# Patient Record
Sex: Female | Born: 1970 | Race: White | Hispanic: No | Marital: Married | Smoking: Never smoker
Health system: Southern US, Community
[De-identification: ages and names within clinical notes are randomized; demographics above are authoritative.]

## PROBLEM LIST (undated history)

## (undated) DIAGNOSIS — N920 Excessive and frequent menstruation with regular cycle: Secondary | ICD-10-CM

## (undated) DIAGNOSIS — M5126 Other intervertebral disc displacement, lumbar region: Secondary | ICD-10-CM

## (undated) DIAGNOSIS — E282 Polycystic ovarian syndrome: Secondary | ICD-10-CM

## (undated) DIAGNOSIS — F32A Depression, unspecified: Secondary | ICD-10-CM

## (undated) DIAGNOSIS — F329 Major depressive disorder, single episode, unspecified: Secondary | ICD-10-CM

## (undated) DIAGNOSIS — D6851 Activated protein C resistance: Principal | ICD-10-CM

## (undated) DIAGNOSIS — F429 Obsessive-compulsive disorder, unspecified: Secondary | ICD-10-CM

## (undated) DIAGNOSIS — E785 Hyperlipidemia, unspecified: Secondary | ICD-10-CM

## (undated) HISTORY — DX: Activated protein C resistance: D68.51

## (undated) HISTORY — PX: OTHER SURGICAL HISTORY: SHX169

## (undated) HISTORY — DX: Excessive and frequent menstruation with regular cycle: N92.0

## (undated) HISTORY — DX: Major depressive disorder, single episode, unspecified: F32.9

## (undated) HISTORY — PX: CHOLECYSTECTOMY: SHX55

## (undated) HISTORY — DX: Depression, unspecified: F32.A

## (undated) HISTORY — DX: Polycystic ovarian syndrome: E28.2

## (undated) HISTORY — DX: Obsessive-compulsive disorder, unspecified: F42.9

## (undated) HISTORY — DX: Other intervertebral disc displacement, lumbar region: M51.26

## (undated) HISTORY — DX: Hyperlipidemia, unspecified: E78.5

## (undated) HISTORY — PX: WISDOM TOOTH EXTRACTION: SHX21

---

## 2002-07-25 ENCOUNTER — Encounter
Admission: RE | Admit: 2002-07-25 | Discharge: 2002-07-25 | Payer: Self-pay | Admitting: Physical Medicine & Rehabilitation

## 2002-07-25 ENCOUNTER — Encounter: Payer: Self-pay | Admitting: Physical Medicine & Rehabilitation

## 2002-07-30 ENCOUNTER — Encounter
Admission: RE | Admit: 2002-07-30 | Discharge: 2002-09-05 | Payer: Self-pay | Admitting: Physical Medicine & Rehabilitation

## 2003-03-13 ENCOUNTER — Encounter
Admission: RE | Admit: 2003-03-13 | Discharge: 2003-06-11 | Payer: Self-pay | Admitting: Physical Medicine & Rehabilitation

## 2003-08-03 ENCOUNTER — Emergency Department (HOSPITAL_COMMUNITY): Admission: EM | Admit: 2003-08-03 | Discharge: 2003-08-03 | Payer: Self-pay | Admitting: Emergency Medicine

## 2003-10-08 ENCOUNTER — Ambulatory Visit (HOSPITAL_COMMUNITY): Admission: RE | Admit: 2003-10-08 | Discharge: 2003-10-09 | Payer: Self-pay | Admitting: Neurosurgery

## 2004-07-07 ENCOUNTER — Other Ambulatory Visit: Admission: RE | Admit: 2004-07-07 | Discharge: 2004-07-07 | Payer: Self-pay | Admitting: Obstetrics and Gynecology

## 2005-06-21 ENCOUNTER — Encounter: Admission: RE | Admit: 2005-06-21 | Discharge: 2005-06-21 | Payer: Self-pay | Admitting: Internal Medicine

## 2006-11-30 ENCOUNTER — Encounter
Admission: RE | Admit: 2006-11-30 | Discharge: 2006-11-30 | Payer: Self-pay | Admitting: Physical Medicine and Rehabilitation

## 2006-12-11 ENCOUNTER — Encounter
Admission: RE | Admit: 2006-12-11 | Discharge: 2006-12-11 | Payer: Self-pay | Admitting: Physical Medicine and Rehabilitation

## 2008-05-06 ENCOUNTER — Inpatient Hospital Stay (HOSPITAL_COMMUNITY): Admission: AD | Admit: 2008-05-06 | Discharge: 2008-05-06 | Payer: Self-pay | Admitting: Obstetrics and Gynecology

## 2008-08-04 ENCOUNTER — Encounter: Admission: RE | Admit: 2008-08-04 | Discharge: 2008-08-28 | Payer: Self-pay | Admitting: Obstetrics and Gynecology

## 2008-08-28 ENCOUNTER — Encounter: Admission: RE | Admit: 2008-08-28 | Discharge: 2008-08-28 | Payer: Self-pay | Admitting: Obstetrics and Gynecology

## 2008-09-23 ENCOUNTER — Inpatient Hospital Stay (HOSPITAL_COMMUNITY): Admission: AD | Admit: 2008-09-23 | Discharge: 2008-09-23 | Payer: Self-pay | Admitting: Obstetrics and Gynecology

## 2008-12-14 ENCOUNTER — Inpatient Hospital Stay (HOSPITAL_COMMUNITY): Admission: AD | Admit: 2008-12-14 | Discharge: 2008-12-18 | Payer: Self-pay | Admitting: Obstetrics and Gynecology

## 2008-12-15 ENCOUNTER — Encounter (INDEPENDENT_AMBULATORY_CARE_PROVIDER_SITE_OTHER): Payer: Self-pay | Admitting: Obstetrics and Gynecology

## 2008-12-22 ENCOUNTER — Ambulatory Visit: Admission: RE | Admit: 2008-12-22 | Discharge: 2008-12-22 | Payer: Self-pay | Admitting: Obstetrics and Gynecology

## 2010-02-07 ENCOUNTER — Inpatient Hospital Stay (HOSPITAL_COMMUNITY): Admission: AD | Admit: 2010-02-07 | Discharge: 2010-02-07 | Payer: Self-pay | Admitting: Obstetrics and Gynecology

## 2010-02-08 ENCOUNTER — Inpatient Hospital Stay (HOSPITAL_COMMUNITY): Admission: AD | Admit: 2010-02-08 | Discharge: 2010-02-08 | Payer: Self-pay | Admitting: Obstetrics and Gynecology

## 2010-07-12 ENCOUNTER — Ambulatory Visit (HOSPITAL_COMMUNITY): Admission: RE | Admit: 2010-07-12 | Discharge: 2010-07-12 | Payer: Self-pay | Admitting: Obstetrics and Gynecology

## 2010-09-27 ENCOUNTER — Inpatient Hospital Stay (HOSPITAL_COMMUNITY)
Admission: RE | Admit: 2010-09-27 | Discharge: 2010-09-30 | Payer: Self-pay | Source: Home / Self Care | Attending: Obstetrics and Gynecology | Admitting: Obstetrics and Gynecology

## 2010-10-04 LAB — CBC
HCT: 28.5 % — ABNORMAL LOW (ref 36.0–46.0)
Hemoglobin: 9.4 g/dL — ABNORMAL LOW (ref 12.0–15.0)
MCH: 30.1 pg (ref 26.0–34.0)
MCHC: 33 g/dL (ref 30.0–36.0)
MCV: 91.3 fL (ref 78.0–100.0)
Platelets: 187 10*3/uL (ref 150–400)
RBC: 3.12 MIL/uL — ABNORMAL LOW (ref 3.87–5.11)
RDW: 13.5 % (ref 11.5–15.5)
WBC: 9 10*3/uL (ref 4.0–10.5)

## 2010-10-04 LAB — GLUCOSE, CAPILLARY
Glucose-Capillary: 100 mg/dL — ABNORMAL HIGH (ref 70–99)
Glucose-Capillary: 87 mg/dL (ref 70–99)
Glucose-Capillary: 121 mg/dL — ABNORMAL HIGH (ref 70–99)
Glucose-Capillary: 106 mg/dL — ABNORMAL HIGH (ref 70–99)

## 2010-10-04 LAB — RPR: RPR Ser Ql: NONREACTIVE

## 2010-11-01 NOTE — Discharge Summary (Signed)
NAMEJAYLIANI, Rachel Ford NO.:  [id number]  MEDICAL RECORD NO.:  [id number]          PATIENT TYPE:  INP  LOCATION:  9145                          FACILITY:  [facility]  PHYSICIAN:  Osborn Coho, M.D.   DATE OF BIRTH:  1971-03-27  DATE OF ADMISSION:  09/27/2010 DATE OF DISCHARGE:  09/30/2010                              DISCHARGE SUMMARY   ADMITTING DIAGNOSES: 1. Intrauterine pregnancy at 40 and 4/7 weeks. 2. Previous cesarean section with desire for repeat. 3. Gestational diabetes. 4. Rh negative. 5. Advanced maternal age.  DISCHARGE DIAGNOSES: 1. Intrauterine pregnancy at 40 and 4/7 weeks. 2. Previous cesarean section with desire for repeat. 3. Gestational diabetes. 4. Rh negative. 5. Advanced maternal age. 6. Postpartum anemia.  PROCEDURES: 1. Repeat low transverse cesarean section. 2. Spinal anesthesia.  HOSPITAL COURSE:  Ms. Shackleton is a 40 year old gravida 3, para 1-0-1-1 at 40 and 4/7 weeks by first trimester ultrasound, 39 and 6/7 weeks by LMP, who presented on the day of admission for repeat low transverse cesarean section.  Her pregnancy had been remarkable for; 1. Advanced maternal age with normal screening. 2. Previous cesarean section with desire for repeat. 3. Rh negative. 4. Presumptive gestational diabetes. 5. PCOS. 6. Group B strep negative.  After arrival, the patient was taken to the operating room where Dr. Estanislado Pandy performed a repeat low transverse cesarean section under spinal anesthesia.  Findings were a viable female, named Jill Alexanders, weight 8 pounds 6 ounces, Apgars were 8 and 9.  Infant was taken to the Full-Term Nursery.  Mother was taken to recovery in good condition.  By postop day #1, the patient was doing well.  She was up ad lib, tolerating a regular diet.  Breastfeeding was initiated.  She was undecided about birth control.  She had a blood sugar at 5 a.m. of 121 after a midnight dinner. Hemoglobin was 9.4 down from 11.8, white  blood cell count was 9.0, and platelet count was 187.  She had a repeat blood sugar done on January 11, which was 106.  JP drain had between 15 and 17 mL of output per 24 hours.  She had no significant issues, had some nausea but was passing flatus and also had a bowel movement.  Infant was initiating breastfeeding fairly slowly but was doing well.  By postop day #3, the patient was doing well.  She still had some nausea, although she was tolerating regular diet.  She was up ad lib, had no significant abdominal pain other than incisional pain.  Infant did have some jaundice, but was going to be allowed to go home.  The patient's father was also sick with an upper respiratory infection.  The patient's JP drain was draining 15 mL overnight, but had minimal by the morning, this was removed without difficulty.  Her incision was clean, dry, and intact.  She did have some mild erythema above the incision site at the site of OR drape, but the incision itself was intact without any obvious difficulty.  Fundus was firm.  Lochia was scant.  The patient was deemed to receive full benefit of her hospital stay and  was discharged home in stable condition.  DISCHARGE INSTRUCTIONS:  Per [facility] handout.  DISCHARGE MEDICATIONS: 1. Motrin 600 mg p.o. q.6 h. p.r.n. pain. 2. Percocet 5/325 one to two p.o. q.3-4 h. p.r.n. pain.  DISCHARGE FOLLOWUP:  Discharge followup will occur in 6 weeks at [facility].  The patient was unsure about contraception.     Renaldo Reel Emilee Hero, C.N.M.   ______________________________ Osborn Coho, M.D.    VLL/MEDQ  D:  09/30/2010  T:  09/30/2010  Job:  [id number]  Electronically Signed by Nigel Bridgeman C.N.M. on 10/01/2010 05:13:35 PM Electronically Signed by Osborn Coho M.D. on 11/01/2010 10:58:59 AM

## 2010-11-29 LAB — BASIC METABOLIC PANEL
BUN: 9 mg/dL (ref 6–23)
CO2: 21 mEq/L (ref 19–32)
Calcium: 8.9 mg/dL (ref 8.4–10.5)
Chloride: 108 mEq/L (ref 96–112)
Creatinine, Ser: 0.61 mg/dL (ref 0.4–1.2)
GFR calc Af Amer: >60 mL/min (ref >60)
GFR calc non Af Amer: >60 mL/min (ref >60)
Glucose, Bld: 141 mg/dL — ABNORMAL HIGH (ref 70–99)
Potassium: 3.7 mEq/L (ref 3.5–5.1)
Sodium: 136 mEq/L (ref 135–145)

## 2010-11-29 LAB — CBC
HCT: 35.2 % — ABNORMAL LOW (ref 36.0–46.0)
Hemoglobin: 11.8 g/dL — ABNORMAL LOW (ref 12.0–15.0)
MCH: 30.1 pg (ref 26.0–34.0)
MCHC: 33.5 g/dL (ref 30.0–36.0)
MCV: 89.8 fL (ref 78.0–100.0)
Platelets: 226 10*3/uL (ref 150–400)
RBC: 3.92 MIL/uL (ref 3.87–5.11)
RDW: 13.5 % (ref 11.5–15.5)
WBC: 10 10*3/uL (ref 4.0–10.5)

## 2010-11-29 LAB — SURGICAL PCR SCREEN
MRSA, PCR: NEGATIVE
Staphylococcus aureus: NEGATIVE

## 2010-12-01 LAB — RH IMMUNE GLOBULIN WORKUP (NOT WOMEN'S HOSP)
ABO/RH(D): O NEG
Antibody Screen: NEGATIVE
Unit division: 0

## 2010-12-06 LAB — RH IMMUNE GLOBULIN WORKUP (NOT WOMEN'S HOSP)
ABO/RH(D): O NEG
Antibody Screen: NEGATIVE

## 2010-12-06 LAB — ANTIBODY SCREEN: Antibody Screen: NEGATIVE

## 2010-12-29 LAB — GLUCOSE, CAPILLARY: Glucose-Capillary: 74 mg/dL (ref 70–99)

## 2010-12-30 LAB — CBC
HCT: 35.5 % — ABNORMAL LOW (ref 36.0–46.0)
Hemoglobin: 10.3 g/dL — ABNORMAL LOW (ref 12.0–15.0)
Hemoglobin: 12 g/dL (ref 12.0–15.0)
MCHC: 33.8 g/dL (ref 30.0–36.0)
MCHC: 33.9 g/dL (ref 30.0–36.0)
MCV: 92.6 fL (ref 78.0–100.0)
Platelets: 216 10*3/uL (ref 150–400)
RBC: 3.27 MIL/uL — ABNORMAL LOW (ref 3.87–5.11)
RBC: 3.83 MIL/uL — ABNORMAL LOW (ref 3.87–5.11)
RDW: 13.7 % (ref 11.5–15.5)
WBC: 8.6 10*3/uL (ref 4.0–10.5)

## 2010-12-30 LAB — GLUCOSE, CAPILLARY
Glucose-Capillary: 82 mg/dL (ref 70–99)
Glucose-Capillary: 87 mg/dL (ref 70–99)
Glucose-Capillary: 99 mg/dL (ref 70–99)

## 2010-12-30 LAB — RH IMMUNE GLOB WKUP(>/=20WKS)(NOT WOMEN'S HOSP): Fetal Screen: NEGATIVE

## 2010-12-30 LAB — RPR: RPR Ser Ql: NONREACTIVE

## 2010-12-30 LAB — GLUCOSE, RANDOM: Glucose, Bld: 97 mg/dL (ref 70–99)

## 2011-01-03 LAB — RH IMMUNE GLOBULIN WORKUP (NOT WOMEN'S HOSP): Antibody Screen: NEGATIVE

## 2011-02-01 NOTE — Discharge Summary (Signed)
NAMEROJEAN, IGE NO.:  [id number]   MEDICAL RECORD NO.:  [id number]          PATIENT TYPE:  INP   LOCATION:  9125                          [facility]ILITY[facility]  [facility]   PHYSICIAN:  Janine Limbo, M.D.DATE OF BIRTH:  07/29/1971   DATE OF ADMISSION:  12/14/2008  DATE OF DISCHARGE:  12/18/2008                               DISCHARGE SUMMARY   ADMITTING DIAGNOSES:  1. Intrauterine pregnancy at 39-5/7 weeks.  2. Gestational diabetes.  3. Positive group B streptococcus.  4. Advanced maternal age.  5. Lower back pain and right lower extremity pain related to previous      back surgery and sciatica.   DISCHARGE DIAGNOSES:  1. Term pregnancy.  2. Gestational diabetes.  3. Failure to progress.  4. Obesity.  5. Persistent left leg pain, post delivery.   PROCEDURES:  1. Primary low transverse cesarean section.  2. Placement and then removal of JP drain.   HOSPITAL COURSE:  Ms. Martindelcampo is a 40 year old, gravida 1, para 0 at 62-  5/7 weeks, who was admitted for induction on the evening of December 14, 2008 due to gestational diabetes.  On admission, fetal heart rate was  reactive.  Physical exam was within normal limits.  Her pregnancy had  been remarkable for:  1. Advanced maternal age.  2. History of infertility.  3. First trimester spotting.  4. Rh negative.  5. Positive group B strep.  6. Increased BMI.  7. Gestational diabetes, diet-controlled.  8. Lower back pain and right lower extremity pain.  Left and right      lower extremity pain related to previous back injury or back      surgery and sciatica.   On admission, cervix was long and closed.  Glucose was 97.  The patient  was placed on Cytotec that night.  By the late morning, her cervix was 2-  3, 80% vertex, -1 and -2.  Artificial rupture of membranes was  accomplished and internal monitors were placed.  There was a 4-minute  deceleration.  This did respond to usual measures and amnioinfusion.  She did  have some variable decelerations subsequent to that.  Pitocin  was stopped.  Fetal heart rate began to recover and Pitocin was begun  again.  An epidural was placed, but the patient did not yet very  comfortable with this.  She continued to have some sporadic  decelerations, but she did have a stable baseline and accelerations  noted.  [place] has continued to be inadequate.  C-section was  offered at 630 when the cervix was still 5, 80, -1 to 0 station with  still some variable decelerations noted, although there were  accelerations noted.  The patient did wish to.  C-section was offered at  that time, however, it was not recommended.  The patient agreed to wait  for 1-2 hours to see what progress will be made.  By 810, her epidural  was still not working well.  Cervix at that time was 8-9, 90%, vertex at  0 station.  Baseline was stable.  Acceleration was noted.  There were  some variable decelerations noted.  The patient did receive some Stadol  and Phenergan.  By 920, the patient was completely dilated with a vertex  at a 0 station.  Temperature was 101.2.  There were variable  decelerations with some contractions.  Although baseline was stable and  there was variability noted.  Over the next hour, there was no progress  made in her pushing decelerations were noted with some late C-section  was recommended and was selected to occur.  Dr. Stefano Gaul took the  patient to the operating room, where a primary low transverse cesarean  section was performed under spinal anesthesia.  Findings were viable  female, weight 6 pounds 4 ounces by the name of Alexus, Apgars were 8  and 9.  Infant was taken to the full-term nursery.  Mother was taken to  recovery in good condition.  She did have a JP drain placed.  She  tolerated the procedure well.  By postop day #1, the patient was doing  well.  She was in the PACU till early morning due to residual numbness  in her left leg that was beginning to  improve.  Her vital signs were  stable.  Her incision dressing was clean, dry, and intact.  JP drain was  draining a small amount at that time.  The patient was placed on HCTZ 25  mg 1 p.o. daily per Dr. Stefano Gaul.  PAS hose was placed.  Later that day,  Anesthesia saw the patient due to this persistence of her left leg  tingling and weakness.  We also got a physical therapy consult and  Neurology consult, all of these felt that this was some residual  preexisting radiculopathy and a Neurology did suggest that this  continued to be monitored by the patient and then follow up would occur  if it persisted.  Continued to improve while she was in the hospital.  She was able to be in both knees, extender free.  There was sensation in  all areas.  It was diminished in the left thigh.  She was able to shower  and ambulate to bathroom without difficulty.  Breast feeding was going  slowly.  By postop day #3, the patient was doing well.  She did have  some more hip pain after a neurological evaluation, the night before.  However, pain medication was helping.  She was able to ambulate and she  did wish to be discharged.  Dr. Stefano Gaul was consulted.  The patient's  fasting blood sugar was 74.  JP drainage was beginning to diminish.  It  was removed without difficulty.  She was deemed to receive full benefit  of her hospital stay and was discharged home.  Her labs on postop day  #1, her hemoglobin was 10.3, white blood cell count 14.8, and platelet  count was 167.   DISCHARGE INSTRUCTIONS:  Per [facility] handout.  The patient  will also monitor her leg status and will let us know if she begins to  have more difficulty.  She will use her pain medicine as needed.   DISCHARGE MEDICATIONS:  1. Motrin 600 mg p.o. q.6 h. p.r.n. pain.  2. Percocet 5/325 one to two p.o. q.2-4 h. p.r.n. pain.  3. HCTZ 1 p.o. daily x7 days total.   FOLLOWUP:  Discharge followup will occur in 6 weeks at [facility] or p.r.n.      Renaldo Reel Emilee Hero, C.N.M.      Janine Limbo,  M.D.  Electronically Signed    VLL/MEDQ  D:  12/18/2008  T:  12/19/2008  Job:  [id number]

## 2011-02-01 NOTE — H&P (Signed)
NAMEBRIELYNN, Ford NO.:  [id number]   MEDICAL RECORD NO.:  [id number]          PATIENT TYPE:  INP   LOCATION:  9167                          [facility]ACILITY:  [facility]   PHYSICIAN:  Hal Morales, M.D.DATE OF BIRTH:  02-15-1971   DATE OF ADMISSION:  12/14/2008  DATE OF DISCHARGE:                              HISTORY & PHYSICAL   Rachel Ford is a 40 year old primigravida who is followed by the  physicians at [facility] who presents today for induction  of labor at 39.[redacted] weeks gestation secondary to gestational diabetes.  Rachel  Tith's current pregnancy is remarkable for:  1. Advanced maternal age.  2. History of infertility.  3. First trimester spotting related to a subchorionic hemorrhage and      anterior placenta previa which resolved.  4. Rh negative blood type.  5. Positive GBS.  6. Increased BMI  7. Gestational diabetes, diet controlled.  8. Lower back pain and right lower extremity pain related to previous      back surgery and sciatica.   PRENATAL LABS:  Rachel. Ford prenatal labs include an initial  hemoglobin of 13.2, hematocrit 40.8, platelet count 371, blood type O  negative, RPR nonreactive, rubella immune, hepatitis B negative, HIV  nonreactive.  She failed her early 1-hour Glucola that was conducted at  [redacted] weeks gestation and had one elevated value on her 3-hour Glucola.  Her hemoglobin at 18 weeks was 11.8.  Her first trimester screen was  normal.  Her AFP was normal and her beta strep test was positive on the  culture done at 35 weeks.   CURRENT MEDICATIONS:  Current medications include prenatal vitamin only  although she has used some Ambient in the third trimester.   HISTORY OF PRESENT PREGNANCY:  Rachel Ford presented for prenatal care  in August at a very early gestation.  She had already been seen for  first trimester bleeding  and had been given a RhoGAM shot on August 18.  Her new OB visit was on August 24 and she was  still spotting a little  bit then and was on pelvic rest.  She was having clots at 9 weeks.  Her  first trimester screen was at 13 weeks.  She had an AFP at 15 weeks  which was normal.  At that point, she had not had any spotting for a  month.  At 18 weeks, she had early Glucola which she failed with a value  of 146 and her hemoglobin at that time was 11.8 and a 3-hour glucose  test was ordered.  Anatomy scan was done of the baby and cardiac anatomy  was not completely visualized; so, a follow-up anatomy scan was done at  20 weeks which did show completely normal cardiac anatomy.  Her 3-hour  Glucola had an elevated reading at 2 hours of 200.  All the other values  were normal but the patient was sent to see a dietician and began  management of insulin resistance.  Through the rest of the second and  third trimester, Rachel. Agyeman had multiple ultrasounds to follow the  well-being of the fetus and in the third trimester, she had multiple  NSTs as well.  She did develop a lot of pelvic, back, and leg pain due  to the stress of the advancing pregnancy and was sent for physical  therapy evaluation.  At 29 weeks, she did have an episode of decreased  fetal movement but fetal monitoring showed appropriate 10 x 10  accelerations and good fetal activity.  At 28 weeks she received her  second RhoGAM shot due to her Rh negative status.  At 34 weeks, an  ultrasound showed normal fluid, a BPP of 8/8, and a cervical length of  3.88 cm.  GBS culture at 35 weeks was positive and during the last  month, she has had increased problems with her right lower extremity and  sciatica on that side and she is admitted this evening for cervical  ripening per orders of Dr. Estanislado Pandy.   OBSTETRICAL HISTORY:  Rachel Ford has never been pregnant before.  She  did have a history of an fertility and PCOS.   ALLERGIES:  Sensitivity to codeine resulting in nausea and vomiting.  No  latex allergies.  No food allergies.   No other medication allergies.   MEDICAL HISTORY:  Rachel. Ford has a history of fertility and PCOS.  She  sometimes gets vaginal yeast infections with antibiotic use.  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  She has had a bladder infection in the past and is a light  drinker of alcohol one times a week when she is not pregnant.  She is Rh  negative.   SURGICAL HISTORY:  Surgical history includes back surgery in January  2005 and wisdom teeth extraction.   FAMILY HISTORY:  She has a maternal grandfather who died from myocardial  infarction.  Her father has high blood pressure.  Her maternal  grandmother had a stroke.  Her paternal grandfather has Alzheimer's  disease.  Her paternal grandmother has Parkinson's disease.  Her mother  had malignant melanoma at the age of 53 and is living.  Her maternal  aunt had some type of cancer which is not specified.  She had some  remote relatives with smoking behaviors and tobacco abuse.  Genetic  history is noncontributory.  Rachel. Ford did not have an amniocentesis.  There is no record of cystic fibrosis screen.  Father of the baby is  listed as having a distant cousin who has a cleft lip or palate, as well  as a distant cousin with mental retardation.   SOCIAL HISTORY:  Rachel. Ford is married to [place].  They  are both Caucasian.  They list religion as Protestant.  She has 16 years  of education and works as an Dance movement psychotherapist and he has 16 years of education  and also works as an Dance movement psychotherapist.  She did report some minor alcohol use  early in the pregnancy.  Denies any use of tobacco products or street  drugs during the pregnancy.   PHYSICAL EXAMINATION TODAY:  VITAL SIGNS:  Temperature 97.5, pulse 78,  respiratory rate 20, blood pressure of 132/73.  HEENT:  Within normal limits.  LUNGS:  Clear to auscultation bilaterally.  HEART: With regular rate and rhythm.  No murmur heard.  BREASTS:   Soft and nontender.  ABDOMEN:  Obese and gravid with uterus soft to palpation.  No  contractions noted.  EXTREMITIES:  Within normal limits with normal DTRs  and negative clonus,  as well as negative Homans x2.  Her Bishop score per Dr. Estanislado Pandy is a 2.   Fetal monitor shows fetal heart rate of 145 with variability present and  multiple accelerations and a reactive tracing.  Toco showed no  contractions.   IMPRESSION AND PLAN:  A 40 year old gravida 1 at 59.5 weeks' gestation  with gestational diabetes mellitus.   Plan is to admit to birthing suite, routine admission orders, induction  of labor per Cytotec by protocol, capillary blood glucose monitoring  every 4 hours in labor.  Treatment of positive GBS with penicillin per  protocol.      Eulogio Bear, CNM      Hal Morales, M.D.  Electronically Signed    JM/MEDQ  D:  12/14/2008  T:  12/14/2008  Job:  [id number]

## 2011-02-01 NOTE — Op Note (Signed)
NAMEMACKENZEY, CROWNOVER NO.:  [id number]   MEDICAL RECORD NO.:  [id number]          PATIENT TYPE:  INP   LOCATION:  9101                          [facility]:  [facility]   PHYSICIAN:  Janine Limbo, M.D.DATE OF BIRTH:  1971/04/22   DATE OF PROCEDURE:  12/15/2008  DATE OF DISCHARGE:                               OPERATIVE REPORT   PREOPERATIVE DIAGNOSES:  1. Term intrauterine gestation.  2. Gestational diabetes mellitus.  3. Failure to progress in labor.  4. Obesity (weight is 251 pounds, height is 5 feet 7 inches).   POSTOPERATIVE DIAGNOSES:  1. Term intrauterine gestation.  2. Gestational diabetes mellitus.  3. Failure to progress in labor.  4. Obesity (weight is 251 pounds, height is 5 feet 7 inches).  5. Right occiput transverse presentation.   PROCEDURES:  1. Primary low transverse cesarean section.  2. Placement of a Jackson-Pratt drain.   SURGEON:  Janine Limbo, MD   FIRST ASSISTANT:  Eulogio Bear, CNM   ANESTHETIC:  Spinal.   DISPOSITION:  Rachel Ford is a 40 year old female, gravida 1, para 0,  who presents at 25 weeks and 5 days' gestation for induction of labor.  The patient has been followed at the [facility] of  [facility] for women.  Her pregnancy has been complicated by  the fact that her age is 37 years.  She had gestational diabetes  mellitus.  She presents for induction with an unfavorable cervix.  The  patient was given Cytotec on December 14, 2008, and Pitocin was started on  December 15, 2008.  The patient's membranes spontaneously ruptured.  The  patient labored very slowly.  The patient had an epidural placed during  her labor, but it did not give adequate pain relief.  The patient was  able to completely dilate her cervix and she pushed for greater than an  hour.  She began having late decelerations.  The fetal head did not  descend beneath below a 0 station.  The patient had previously had an  intrauterine pressure catheter placed and an amnioinfusion was begun.  She continued to have the decelerations.  A cesarean section was  recommended and accepted by the patient.  The patient accepted the risks  of, but not limited to, anesthetic complications, bleeding, infection,  and possible damage to surrounding organs.   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  The Apgars scores were 8 at 1-  minute and 9 at 5 minutes.  The infant was noted to have an umbilical  cord that was wrapped around the shoulder and also wrapped around the  right wrist.  The procedure was complicated by the fact that the patient  had a very obese abdomen which made the surgery difficult.  The uterus,  fallopian tubes, and the ovaries appeared normal.   PROCEDURE:  The patient was taken to the operating room where it was  determined that the epidural she had for labor would not be adequate for  cesarean section.  The epidural was  removed and a spinal anesthetic was  then placed.  A Foley catheter had previously been placed.  The lower  abdomen was prepped with multiple layers of Betadine and then sterilely  draped.  The lower abdomen was injected with 10 mL of 0.5% Marcaine with  epinephrine.  A low transverse incision was made in the abdomen and  carried sharply through the subcutaneous tissue, the fascia, and the  anterior peritoneum.  The surgery was complicated by the patient's  markedly obese abdomen.  An incision was made in the lower uterine  segment and extended in a low transverse fashion.  The fetal head was  delivered without difficulty.  The mouth and nose were suctioned.  The  umbilical cord was noted to be wrapped around the infant's shoulder.  The remainder of the infant was then delivered.  The umbilical cord was  also noted to be wrapped around the infant's right wrist.  The cord was  clamped and cut, and the infant  was handed to the awaiting pediatric  team.  The placenta was removed and then sent with the cord blood  registry team.  The placenta was sent to Pathology.  The uterine cavity  was cleaned of amniotic fluid, clotted blood, and membranes.  The  uterine incision was closed using a running locking suture of 2-0 Vicryl  followed by an imbricating suture of 2-0 Vicryl.  Figure-of-eight  sutures of 2-0 Vicryl were used for hemostasis.  Hemostasis was noted to  be adequate.  The pelvis was vigorously irrigated.  The bladder flap was  closed using a running suture of 2-0 Vicryl.  The anterior peritoneum  was closed using a running suture of 2-0 Vicryl.  The fascia was closed  using 2 running sutures of 0 Vicryl from the corners to the midline.  Three interrupted sutures of 0 Vicryl were placed in the fascia.  The  subcutaneous layer was irrigated.  A Jackson-Pratt drain was placed in  the subcutaneous layer and brought out through the left lower quadrant.  It was sutured into place using 2-0 silk.  The subcutaneous layer was  closed using a running suture of 2-0 plain catgut.  The skin was  reapproximated using a subcuticular suture of 3-0 Monocryl.  A pressure  dressing was placed.  Sponge, needle, and instrument counts were correct  on 2 occasions.  Estimated blood loss for the procedure was 800 mL.  The  patient tolerated her procedure well.  She was transported to the  recovery room in stable condition.  The infant was taken to the full-  term nursery in stable condition.  The placenta was sent to Pathology  for evaluation.      Janine Limbo, M.D.  Electronically Signed     AVS/MEDQ  D:  12/16/2008  T:  12/16/2008  Job:  [id number]

## 2011-02-01 NOTE — Consult Note (Signed)
NAMEBRISTYL, MCLEES NO.:  [id number]   MEDICAL RECORD NO.:  [id number]        PATIENT TYPE:  WINP   LOCATION:                                FACILITY:  [facility]   PHYSICIAN:  Levert Feinstein, MD               DATE OF BIRTH:   DATE OF CONSULTATION:  DATE OF DISCHARGE:  12/18/2008                                 CONSULTATION   CHIEF COMPLAINT:  Left lower extremity numbness and weakness.   HISTORY OF PRESENT ILLNESS:  The patient is a 40 year old Caucasian  female, had C-sectionon December 15, 2008, 11:29pm.  The patient had went  into delivery earlier that afternoon, and also tried vaginal delivery,  pushing with hip flexion position for about 1 hour without progress.  Per patient, there was fetal distress.  So, the epidural was changed to  spinal anesthesia.  She underwent elective C-section.   Post C-section, she required prolonged recovery.  Right leg was able to  regain full function very quickly, but has persistent left leg weakness  and numbness, but has much improved since yesterday, and continue to  improve today.  However, she still complains whole left leg was numb,  also complains of left hip pain and also C-section wound pain.   She does have a history of left L5-S1 herniated disk in 2005, presenting  with low back pain, radicular type pain involving left lower extremity,  lost use of the left lower extremity at its maximum, underwent left L5-  S1 microdiskectomy using microdissection by Dr. Tressie Stalker in  October 08, 2003, has regained very good recovery; however, she has  residual mild left lateral leg numbness, and left toe flexion weakness  since.   REVIEW OF SYSTEMS:  Complains C-section pain.   PAST SURGICAL HISTORY:  Complains of C-section wound area pain.   PAST MEDICAL HISTORY:  Gestational diabetes, low back pain, and left L5-  S1 herniated disk, and microdiskectomy using microdissection.   PAST FAMILY HISTORY:  Maternal grandfather died of  myocardial  infarction, father has hypertension.  Maternal grandmother had a stroke.  Paternal grandfather has Alzheimer's disease.  Paternal grandmother had  Parkinson's disease and mother had malignant melanoma at age 48,  maternal aunt has cancer of unknown.   SOCIAL HISTORY:  Denies smoke or drink.   PHYSICAL EXAMINATION:  VITALS:  Temperature 97.9, blood pressure 99/64,  heart rate of 60, and respiration of 18.  CARDIAC:  Regular rate and rhythm.  PULMONARY:  Clear to auscultation.  NECK:  Supple.  No carotid bruits.  NEUROLOGIC:  Cranial nerve II through XII.  Pupil equal, round, and  reactive to light.  Extraocular movements were full.  Facial sensation  and strength was normal.  Uvula, tongue midline.  Head turning, shoulder  shrugging normal and symmetric.  Tongue protruding into cheek strength  was normal.  MOTOR:  Upper extremity was normal.  Lower extremity was limited because  of the C-section incision pain and the left hip pain, she has mild left  toe flexion weakness 5-/5.  Sensory: she complains  of mild numbness on  the whole left lower extremity.  Deep tendon reflexes: brisk in the  upper extremity and bilateral patella.  Absent left Achilles reflex,  present on the right side 2/4.  Plantar responses were flexor. Gait  fairly steady, but has difficulty performing left tiptoe walking, which  was present since her left L5-S1 disecktomy.   ASSESSMENT AND PLAN:  A 40 year old female, presenting with left hip  pain, left leg numbness and weakness, but steadily improved overall.  There is evidence of mild chronic left S1 radiculopathy.  Likely, the  residual findings from her previous left S1 radiculopathy.   Today's examination it is limited because of pain, but I do not see any  proximal left muscle weakness.  Overall, she has showed continued  improvement, we will continue to observe her symptoms and repeat  neurological examination as needed.   If she continue to  complains of left hip pain and low back pain, may  consider further imaging study such as MRI-left hip and MRI-low back.      Levert Feinstein, MD  Electronically Signed     YY/MEDQ  D:  12/17/2008  T:  12/18/2008  Job:  [id number]

## 2011-11-03 ENCOUNTER — Other Ambulatory Visit: Payer: Self-pay | Admitting: Obstetrics and Gynecology

## 2011-11-03 DIAGNOSIS — Z1231 Encounter for screening mammogram for malignant neoplasm of breast: Secondary | ICD-10-CM

## 2011-11-11 ENCOUNTER — Ambulatory Visit: Payer: Self-pay

## 2011-11-28 ENCOUNTER — Ambulatory Visit
Admission: RE | Admit: 2011-11-28 | Discharge: 2011-11-28 | Disposition: A | Discharge status: Home / Self Care | Payer: 59 | Source: Ambulatory Visit | Attending: Obstetrics and Gynecology | Admitting: Obstetrics and Gynecology

## 2011-11-28 DIAGNOSIS — Z1231 Encounter for screening mammogram for malignant neoplasm of breast: Secondary | ICD-10-CM

## 2012-04-17 ENCOUNTER — Other Ambulatory Visit: Payer: Self-pay | Admitting: Internal Medicine

## 2012-04-19 ENCOUNTER — Telehealth: Payer: Self-pay | Admitting: Obstetrics and Gynecology

## 2012-04-19 NOTE — Telephone Encounter (Signed)
LAURA/INFECT.

## 2012-04-20 ENCOUNTER — Telehealth: Payer: Self-pay

## 2012-04-20 NOTE — Telephone Encounter (Signed)
LMTC @ 1:45  ld

## 2012-05-14 ENCOUNTER — Encounter: Payer: 59 | Admitting: Obstetrics and Gynecology

## 2012-05-31 ENCOUNTER — Encounter: Payer: 59 | Admitting: Obstetrics and Gynecology

## 2012-07-03 ENCOUNTER — Encounter: Payer: 59 | Admitting: Obstetrics and Gynecology

## 2012-07-13 ENCOUNTER — Encounter: Payer: 59 | Admitting: Obstetrics and Gynecology

## 2012-08-08 ENCOUNTER — Encounter: Payer: 59 | Admitting: Obstetrics and Gynecology

## 2012-08-09 ENCOUNTER — Encounter: Payer: 59 | Admitting: Obstetrics and Gynecology

## 2012-08-15 ENCOUNTER — Encounter: Payer: Self-pay | Admitting: Obstetrics and Gynecology

## 2012-08-15 ENCOUNTER — Ambulatory Visit (INDEPENDENT_AMBULATORY_CARE_PROVIDER_SITE_OTHER): Payer: 59 | Admitting: Obstetrics and Gynecology

## 2012-08-15 VITALS — BP 112/68 | Ht 67.0 in | Wt 254.0 lb

## 2012-08-15 DIAGNOSIS — E282 Polycystic ovarian syndrome: Secondary | ICD-10-CM | POA: Insufficient documentation

## 2012-08-15 DIAGNOSIS — N943 Premenstrual tension syndrome: Secondary | ICD-10-CM

## 2012-08-15 DIAGNOSIS — M5126 Other intervertebral disc displacement, lumbar region: Secondary | ICD-10-CM | POA: Insufficient documentation

## 2012-08-15 DIAGNOSIS — F3281 Premenstrual dysphoric disorder: Secondary | ICD-10-CM

## 2012-08-15 NOTE — Addendum Note (Signed)
Addended by: Larwance Rote on: 08/15/2012 02:38 PM   Modules accepted: Orders

## 2012-08-15 NOTE — Progress Notes (Signed)
  Current contraception: Condoms. Hormone replacement therapy: No New medication: No  History of RUE:AVWU  History of infertility: no. History of abnormal Pap smear: no History of fibroids: No  Increased stress: Yes  Can not sleep Anxiety Issues   Abnormal bleeding pattern started: Since birth of son who is 2 yrs. Pt has no complaints of abdominal or pelvic pain. LMP was over 2 weeks. It is heavy for 4 days. Known PCOS   Subjective:     Rachel Ford is a 41 y.o. woman,G3P2, who presents for irregular menses.  Bleeding pattern: Cycles are 10 days of bleeding first 4 heavy and clotting. 11 days without bleeding. Pt having depression 2-3 days before cycle starting. Pt states that she is having crying spells, and "feels like someone has taken over her body". Depression stops when bleeding starts.Even has innappropriate comments at work and anger spells mainly towards husband. Also c/o sleep deprivation. Pt has tried trazadone, sonata, and vistaril for sleeping. Discussed with Dr Nolen Mu that sounds like PMDD and to consider hormonal treatment. Has struggled with severe anxiety and depression since last birth 2 years ago.  Denies any urinary tract symptoms, changes in bowel movements, nausea, vomiting or fever.   Current contraception: none.  The following portions of the patient's history were reviewed and updated as appropriate: allergies, current medications, past family history, past medical history, past social history and past surgical history.  Review of Systems Pertinent items are noted in HPI.    Objective:    BP 112/68  Ht 5\' 7"  (1.702 m)  Wt 254 lb (115.214 kg)  BMI 39.78 kg/m2  Weight:  Wt Readings from Last 1 Encounters:  08/15/12 254 lb (115.214 kg)    BMI: Body mass index is 39.78 kg/(m^2).  General Appearance: Alert, appropriate appearance for age. No acute distress. Tearful ++. Accompanied by supportive husband.     Assessment:   Severe PMDD  Father with  H/O DVT   Plan:   Sleep Deprivation discussed Hormones Discussed: reviewed PMDD with Progesterone peak Depo-Lupron Therapy Discussed: with add-back therapy, R&B. Pt very reluctant Lo LoEstrin Reccommended: pt very reluctant because of visual problems developing after use of BCP 15 years ago (permanent flutters) Prozac for PMS and PMDD discussed: pt has filled script from Dr Nolen Mu.  Will do complete thrombophilia panel Neurologist consult  Recommended with Dr. Clint Bolder: since the beginning of symptoms pt reports loss of perception of time and some memory...organic cause? 45 minutes face-to-face with pt   Combined oral contraceptives was reviewed with the patient      With expected benefits of: cycle control, reduction in menstrual flow and dysmenorrhea, improvement of PMS and reduction of ovarian cysts and ovarian cancer. Risks of DVT/PE discussed.  Nuvaring was also reviewed With added benefit of monthly use as opposed to daily use was also reviewed.  Tobacco use: none, withouthistory of DVT/PE. Pertinent medical history:none   Silverio Lay MD

## 2012-08-20 ENCOUNTER — Telehealth: Payer: Self-pay

## 2012-08-20 LAB — HYPERCOAGULABLE PANEL, COMPREHENSIVE
AntiThromb III Func: 108 % (ref 76–126)
Beta-2 Glyco I IgG: 1 G Units (ref <20)
Beta-2-Glycoprotein I IgM: 7 M Units (ref <20)
DRVVT: 31.2 secs (ref <42.9)
Lupus Anticoagulant: NOT DETECTED
Protein C Activity: 176 % — ABNORMAL HIGH (ref 75–133)
Protein S Activity: 105 % (ref 69–129)

## 2012-08-20 NOTE — Telephone Encounter (Signed)
LVM returning call regarding scheduling apt for pt.  Darien Ramus, CMA   LVM returning call regarding scheduling apt for pt Darien Ramus, CMA

## 2012-08-20 NOTE — Telephone Encounter (Signed)
Left VM at Neurology office for Dr. Marcelino Freestone to schedule pt apt. Darien Ramus, CMA

## 2012-08-20 NOTE — Telephone Encounter (Signed)
Message copied by Darien Ramus on Mon Aug 20, 2012  2:39 PM ------      Message from: Silverio Lay      Created: Wed Aug 15, 2012  1:30 PM       Please make referral. See note

## 2012-08-22 NOTE — Telephone Encounter (Signed)
Apt scheduled [facility] for 10/17/2012 @ 2:15 p.m.   Advised pt of apt and that office would mail packet Notes faxed to [phone] Darien Ramus, CMA

## 2012-08-29 ENCOUNTER — Telehealth: Payer: Self-pay | Admitting: Obstetrics and Gynecology

## 2012-08-29 NOTE — Telephone Encounter (Signed)
Please inform that Thrombophilia panel reveals: 1. Heterozygous for Factor V Leiden             2. Increased Protein C Needs evaluation with Dr Marlena Clipper before starting BCP. Please make referral In the meantime, encourage patient to use Prozac

## 2012-08-29 NOTE — Telephone Encounter (Signed)
TC to pt who requests results which were reviewed. In light of results,  Pt questioning if OK to start OCP.  Does not want Nuvaring.  LMP 08/19/12. DR SR to be made aware.

## 2012-08-30 NOTE — Telephone Encounter (Signed)
Called pt to advise of results. Also advised that I am going to call and schedule apt with Dr. Cyndie Chime. Advised pt per SR to continue prozac. Pt states that she has taken herself off of this med due to it "making her do crazy things again" States that she was advised by her Therapist to start BCP asap. Advised that she needing to see this Dr. Before starting BCP.   Called Dr. Patsy Lager office @ [phone] LVM for return call to schedule New Patient apt.   Darien Ramus, CMA

## 2012-08-31 NOTE — Telephone Encounter (Signed)
VM from [facility] regarding referral. Called back and LVM for return call.  [phone]  Darien Ramus, CMA

## 2012-09-03 ENCOUNTER — Telehealth: Payer: Self-pay | Admitting: Obstetrics and Gynecology

## 2012-09-03 NOTE — Telephone Encounter (Signed)
TC to pt in response to pt obtaining her lab results. Reviewed results and explained as much as possible.  Also reassured that it is usual protocol to refer to hematologist.  Pt states is having a back injection  09/04/12.  Advised to show provider results and discuss whether is recommended to have procedure. Pt verbalizes comprehension.

## 2012-09-05 NOTE — Telephone Encounter (Signed)
[facility]work [facility]ed to the [facility]. Office states they will contact pt with apt.   Darien Ramus, CMA

## 2012-09-05 NOTE — Telephone Encounter (Signed)
Pt aware of Dr. Patsy Lager office contacting her with apt.  Darien Ramus, CMA

## 2012-09-06 ENCOUNTER — Telehealth: Payer: Self-pay | Admitting: Oncology

## 2012-09-06 NOTE — Telephone Encounter (Signed)
C/D 09/06/12 for appt. 09/26/12

## 2012-09-06 NOTE — Telephone Encounter (Signed)
S/W PT IN REF TO NP APPT. ON 09/26/12@1 :30 REFERRING DR RIVARD FACTOR Elyn Peers MAILED NP PACKET

## 2012-09-20 ENCOUNTER — Encounter: Payer: Self-pay | Admitting: Oncology

## 2012-09-20 ENCOUNTER — Other Ambulatory Visit: Payer: Self-pay | Admitting: Oncology

## 2012-09-20 DIAGNOSIS — D6851 Activated protein C resistance: Secondary | ICD-10-CM | POA: Insufficient documentation

## 2012-09-20 HISTORY — DX: Activated protein C resistance: D68.51

## 2012-09-25 ENCOUNTER — Telehealth: Payer: Self-pay | Admitting: Oncology

## 2012-09-25 NOTE — Telephone Encounter (Signed)
Pt called to r/s appt.to 10/31/12@11 :00

## 2012-09-26 ENCOUNTER — Encounter: Payer: 59 | Admitting: Oncology

## 2012-09-26 ENCOUNTER — Other Ambulatory Visit: Payer: 59 | Admitting: Lab

## 2012-10-31 ENCOUNTER — Other Ambulatory Visit: Payer: 59 | Admitting: Lab

## 2012-10-31 ENCOUNTER — Encounter: Payer: 59 | Admitting: Oncology

## 2012-10-31 ENCOUNTER — Ambulatory Visit: Payer: 59

## 2012-10-31 ENCOUNTER — Telehealth: Payer: Self-pay | Admitting: *Deleted

## 2012-10-31 ENCOUNTER — Encounter: Payer: Self-pay | Admitting: Oncology

## 2012-10-31 NOTE — Progress Notes (Signed)
42 year old woman recently found to be a heterozygote for the factor V Leiden gene mutation. She called yesterday to cancel today's appointment in view of the anticipated winter storm. She was not removed from my schedule today. In fact, I have no chart available to review. She'll be rescheduled.

## 2012-10-31 NOTE — Telephone Encounter (Signed)
Called pt re:  Missed appt today.  She reports that she left a message on Rose's vm yest.  Tiffany/HIM notified.

## 2013-07-02 ENCOUNTER — Other Ambulatory Visit: Payer: Self-pay

## 2013-07-02 ENCOUNTER — Other Ambulatory Visit: Payer: Self-pay | Admitting: Obstetrics and Gynecology

## 2013-07-02 DIAGNOSIS — Z1231 Encounter for screening mammogram for malignant neoplasm of breast: Secondary | ICD-10-CM

## 2013-07-17 ENCOUNTER — Ambulatory Visit
Admission: RE | Admit: 2013-07-17 | Discharge: 2013-07-17 | Disposition: A | Discharge status: Home / Self Care | Payer: 59 | Source: Ambulatory Visit

## 2013-07-17 DIAGNOSIS — Z1231 Encounter for screening mammogram for malignant neoplasm of breast: Secondary | ICD-10-CM

## 2013-11-18 ENCOUNTER — Encounter: Payer: Self-pay | Admitting: Oncology

## 2014-04-17 ENCOUNTER — Encounter: Payer: 59 | Attending: Obstetrics and Gynecology | Admitting: *Deleted

## 2014-04-17 ENCOUNTER — Encounter: Payer: Self-pay | Admitting: *Deleted

## 2014-04-17 DIAGNOSIS — E669 Obesity, unspecified: Secondary | ICD-10-CM | POA: Diagnosis present

## 2014-04-17 DIAGNOSIS — Z713 Dietary counseling and surveillance: Secondary | ICD-10-CM | POA: Insufficient documentation

## 2014-04-17 DIAGNOSIS — E282 Polycystic ovarian syndrome: Secondary | ICD-10-CM | POA: Insufficient documentation

## 2014-04-17 DIAGNOSIS — E785 Hyperlipidemia, unspecified: Secondary | ICD-10-CM

## 2014-04-17 NOTE — Progress Notes (Signed)
Medical Nutrition Therapy:  Appt start time: 0900 end time:  1000.   Assessment:  Primary concerns today: Fransheska was referred for nutrition counseling.  She reports gaining about 40 pounds over the past year and 15 pounds in the last 2 months.  She reports poor sleep for the last 3 years and that could have attributed to her weight gain.  She also has PCOS and was diagnosed more than 10 years ago.  She never received any form of treatment.  Recent blood work indicated hyperglycemia, hyperlipidemia, and low vitamin D.  She had GDM in both pregnancies.   She has attempted to lose weight in the past: tried Weight Watchers many years ago.  In her most recent pregnancy she followed the diabetic diet and that was very helpful.  She lost weight in both pregnancies.  She feels the carb control diet is most effective in combination with exercise; but she feels like she isn't able to exercise now.  She craves carbs (refined carbs) and caffeine because she can't sleep and is so tired.  Has young children who want refined carbohydrate foods.  Her husband is healthy, but she follows her kids' diet.  Drinks more lately: 5 at a time once a week.  Feels like she has no self-control.    Preferred Learning Style:   Visual   Learning Readiness:   Ready   MEDICATIONS: see list   DIETARY INTAKE:  Usual eating pattern includes 3 meals and 0-2 snacks per day.  Everyday foods include convenience foods.  Avoided foods include none, but diet is very low in fruits, vegetables, low-far dairy, and whole grains.    24-hr recall:  B ( AM): English muffin with peanut butter.  Sometimes 1 egg and 1 egg white with tomatoes, [place] sausage  Snk ( AM): not likely sometimes greek yogurt L ( PM): whatever takeout she can get downtown: [place], subway Snk ( PM): not likely, but has been craving chocolate and sometimes she eats candy.  Sometimes almonds D ( PM): fast food: salad or hamburger; sometimes pizza Snk ( PM): not  usually Beverages: diet soda  Usual physical activity: walks 2 times a month.  Tries to walk the stairs, but cant' walk that far  Estimated energy needs: 1800 calories 200 g carbohydrates 135 g protein 50 g fat    Nutritional Diagnosis:  Norton-2.1 Inpaired nutrition utilization As related to carbohydrates.  As evidenced by PCOS and hyperglyemia.    Intervention:  Nutrition counseling provided.  Discussed physiology of carbohydrate metabolism and how it is affected by PCOS.  Discussed hormonal imbalances associated with PCOS and how those imbalances present themselves with hirsutism, body acne, menstrual irregularity, obesity, and poor glycemic control.  Dicussed possible increased risk for CVD and the importance of nutrition management for overall health.   Recommended the Mediterranean style eating plan: MUFAs, whole grains, fruits, vegetables, legumes, lean proteins, and low-fat dairy.  Recommended limiting refined carbohydrates and concentrated sweets.  Suggested regularly scheduled meals and snacks and to avoid meal skipping.  Recommended fiber and lean protein with all meals and to include non-starchy vegetables with most meals.  Encouraged limiting carbohydrates at each meal and to increase vegetables  Recommended regular physical activity of 150 minutes/week.  Discussed reading food labels: focusing on fiber and limited sugars and saturated, trans fats.   Teaching Method Utilized:  Visual Auditory   Handouts given during visit include: Living Well with Diabetes Meal Plan Card Reading food labels    Barriers  to learning/adherence to lifestyle change: getting motivated to plan ahead and prepare nutritious meals  Demonstrated degree of understanding via:  Teach Back   Monitoring/Evaluation:  Dietary intake, exercise, and body weight in 6 week(s).

## 2014-05-21 ENCOUNTER — Encounter: Payer: Self-pay | Admitting: Internal Medicine

## 2014-05-21 ENCOUNTER — Ambulatory Visit (INDEPENDENT_AMBULATORY_CARE_PROVIDER_SITE_OTHER): Payer: 59 | Admitting: Internal Medicine

## 2014-05-21 VITALS — BP 128/80 | HR 82 | Temp 97.9°F | Resp 12 | Ht 67.0 in | Wt 264.4 lb

## 2014-05-21 DIAGNOSIS — E282 Polycystic ovarian syndrome: Secondary | ICD-10-CM

## 2014-05-21 LAB — HEMOGLOBIN A1C: HEMOGLOBIN A1C: 5.7 % (ref 4.6–6.5)

## 2014-05-21 MED ORDER — METFORMIN HCL 1000 MG PO TABS: 1000.0000 mg | ORAL_TABLET | Freq: Two times a day (BID) | ORAL | Status: DC

## 2014-05-21 NOTE — Progress Notes (Signed)
Patient ID: Rachel Ford, female   DOB: 09-16-1971, 43 y.o.   MRN: [id number]  HPI: Rachel Ford is a 43 y.o. female, referred by her PCP, Dr. Ricki Miller, for management of PCOS, dx 20 years ago.  She also sees Dr. Estanislado Pandy (ObGyn) and Denny Levy (nutrition).  Weight gain: - in last year >> slowly increased (total, adult, 40 lbs) - she saw Vernona Rieger (nutrition) on 04/17/2014 >> since then she lost 10-15 lbs (low carb diet)! - + steroid inj: 3, all 18 mo ago - no weight loss meds - Meals ("I cannot control my cravings" - but these have gotten better on her new diet): - Breakfast: eggwhite omelet or whole wheat English muffin + PB - Lunch: salad with grilled chicken - Dinner: varies - Snacks: 2 Drinks: 2 diet sodas a day + flavored water (artificial sweeteners) - Diets tried: none other than the low carb diabetic diet - Exercise: no - has had GDM with both pregnancies  Fertility/Menstrual cycles: - + h/o irregular menses, then more regular since her 30s, but heavy - + h/o ovarian cysts - children: 2 - miscarriages: 1 - contraception: no, because she has a heterozygous factor 5 Leyden mutation  After the birth of her last child: - depression - PTSD - insomnia >> started Ambien 10 >> helps  Acne: - before cycles  Hirsutism: - chin - shaves every day  Treatments tried: - Metformin 1000 mg qhs - started 1 mo ago. - did not try Spironolactone - did not try [place]  Other meds: - SSRIs: Prozac  She tells me she was sleeping very poorly after the birth of her last child (3.5 y/o), but now better on Ambien.  Thyroid tests: reportedly normal this summer  ROS: Constitutional: + weight gain, no fatigue, no subjective hyperthermia/hypothermia, + poor sleep Eyes: no blurry vision, no xerophthalmia ENT: no sore throat, no nodules palpated in throat, no dysphagia/odynophagia, no hoarseness Cardiovascular: + CP/no SOB/palpitations/leg swelling Respiratory: no  cough/SOB Gastrointestinal: no N/V/D/C Musculoskeletal: +muscle aches/no joint aches Skin: + acne, + hair on face, + hair loss on scalp Neurological: no tremors/numbness/tingling/dizziness, + HA Psychiatric: + both: depression/anxiety + Low libido  Past Medical History  Diagnosis Date  . PCOS (polycystic ovarian syndrome)   . Lumbar herniated disc   . Menorrhagia   . Depression     post partum  . Factor 5 Leiden mutation, heterozygous 09/20/2012  . Hyperlipidemia    Past Surgical History  Procedure Laterality Date  . Herniated disc surgery    . Wisdom tooth extraction    . Cesarean section     History   Occupational History  . actuary   Social History Main Topics  . Smoking status: Never Smoker   . Smokeless tobacco: Never Used  . Alcohol Use: Yes     Comment: ocaasional wine and liquor - 3 drinks once a week  . Drug Use: No  . Sexual Activity: Yes    Birth Control/ Protection: Condom   Social History Narrative   Married; 2 children; ages 37 and 3 yrs   Began menstrual cycle at 13 yrs   3 pregnancies; 1 miscarriage   Name  Route  Sig   . FLUoxetine (PROZAC) 40 MG capsule   Oral   Take 40 mg by mouth daily.    Marland Kitchen zolpidem (AMBIEN) 10 MG tablet   Oral   Take 10 mg by mouth at bedtime as needed for sleep.    Marland Kitchen glucose blood (ACCU-CHEK  COMFORT CURVE) test strip          . hydrOXYzine (ATARAX/VISTARIL) 50 MG tablet   Oral   Take 50 mg by mouth at bedtime as needed.    . Lancets 28G MISC          . Melatonin 1 MG CAPS   Oral   Take by mouth.    . metFORMIN (GLUCOPHAGE) 1000 MG tablet   Oral   Take 1 tablet (500 mg total) by mouth 2 (two) times daily with a meal.      Allergies  Allergen Reactions  . Codeine    Family History  Problem Relation Age of Onset  . Cancer Other     melanoma   . Hypertension Father    PE: BP 128/80  Pulse 82  Temp(Src) 97.9 F (36.6 C) (Oral)  Resp 12  Ht  (1.702 m)  Wt 264 lb 6.4 oz (119.931 kg)  BMI  41.40 kg/m2  SpO2 97% Wt Readings from Last 3 Encounters:  05/21/14 264 lb 6.4 oz (119.931 kg)  08/15/12 254 lb (115.214 kg)   Constitutional: overweight, in NAD, no full supraclavicular fat pads Eyes: PERRLA, EOMI, no exophthalmos ENT: moist mucous membranes, no thyromegaly, no cervical lymphadenopathy Cardiovascular: RRR, No MRG Respiratory: CTA B Gastrointestinal: abdomen soft, NT, ND, BS+ Musculoskeletal: no deformities, strength intact in all 4 Skin: moist, warm; no acne on face, + dark terminal hair on chin, + vellum on sideburns, no skin tags, Neurological: no tremor with outstretched hands, DTR normal in all 4  ASSESSMENT: 1. PCOS  PLAN: 1.  I had a long discussion with the patient about the fact that the PCOS is a misnomer, a patient does not necessarily have to have polycystic ovaries to be diagnosed with the disorder. This is of sum of several conditions, including:  weight gain  insulin resistance (and therefore a higher risk of developing diabetes later in life)  acne  hirsutism  irregular menstrual cycles  decreased fertility. - We also discussed about the fact that the treatment is usually targeted to addressing the problem that concerns the patient the most: acne/hirsutism, weight gain, or fertility, but there is no single treatment for PCOS.  - The first-line therapy are oral contraceptives (which she cannot use 2/2 her factor 5 Leyden mutation). If she is concerned with her weight, we can use metformin (she is on this, and she is already having good effects on weight with this combined with diet); if she is concerned about acne/hirsutism, we can add spironolactone (discussed benefit and possible SEs), Vaniqua cream (discussed benefit and possible SEs), or she can use electrolysis (especially since her hirsute area is relatively small); and if she is concerned about fertility, I could refer her to reproductive endocrinology for possible use of clomiphene (she is  not planning for having more children). - for now, we decided to increase the Metformin to 1000 mg bid - we will also check: Orders Placed This Encounter  Procedures  . Testosterone, free, total  . HgB A1c  - I advised her to d/w Dr Estanislado Pandy if Prometrium would be a good option for her for sleep. I doubt that this will cause hypercoagulability in her, but I am not sure.  Return in about 6 months (around 11/19/2014).  Office Visit on 05/21/2014  Component Date Value Ref Range Status  . Testosterone 05/21/2014 35  10 - 70 ng/dL Final   Comment:  Tanner Stage       Female              Female                                        I              < 30 ng/dL        < 10 ng/dL                                        II             < 150 ng/dL       < 30 ng/dL                                        III            100-320 ng/dL     < 35 ng/dL                                        IV             200-970 ng/dL     16-10 ng/dL                                        V/Adult        300-890 ng/dL     96-04 ng/dL                             . Sex Hormone Binding 05/21/2014 17* 18 - 114 nmol/L Final  . Testosterone, Free 05/21/2014 8.8* 0.6 - 6.8 pg/mL Final   Comment:                            The concentration of free testosterone is derived from a mathematical                          expression based on constants for the binding of testosterone to sex                          hormone-binding globulin and albumin.  . Testosterone-% Free 05/21/2014 2.5* 0.4 - 2.4 % Final  . Hemoglobin A1C 05/21/2014 5.7  4.6 - 6.5 % Final   Glycemic Control Guidelines for People with Diabetes:Non Diabetic:  <6%Goal of Therapy: <7%Additional Action Suggested:  >8%    HbA1c low, borderline in the prediabetic range, but at this low levels, the test is not very accurate. Testosterone high, confirming PCOS.

## 2014-05-21 NOTE — Patient Instructions (Addendum)
Please increase Metformin to 1000 mg 2x a day and take it with meals. Please let me know if you like to try Spironolactone or Vaniqua (cream) for decreasing the extra hair. Check with your ObGyn Dr if you can try Prometrium for sleep.  Good job with your diet!  Please return in 6 months with your sugar log.   Please stop at the lab.  Polycystic Ovarian Syndrome Polycystic ovarian syndrome (PCOS) is a common hormonal disorder among women of reproductive age. Most women with PCOS grow many small cysts on their ovaries. PCOS can cause problems with your periods and make it difficult to get pregnant. It can also cause an increased risk of miscarriage with pregnancy. If left untreated, PCOS can lead to serious health problems, such as diabetes and heart disease. CAUSES The cause of PCOS is not fully understood, but genetics may be a factor. SIGNS AND SYMPTOMS   Infrequent or no menstrual periods.   Inability to get pregnant (infertility) because of not ovulating.   Increased growth of hair on the face, chest, stomach, back, thumbs, thighs, or toes.   Acne, oily skin, or dandruff.   Pelvic pain.   Weight gain or obesity, usually carrying extra weight around the waist.   Type 2 diabetes.   High cholesterol.   High blood pressure.   Female-pattern baldness or thinning hair.   Patches of thickened and dark brown or black skin on the neck, arms, breasts, or thighs.   Tiny excess flaps of skin (skin tags) in the armpits or neck area.   Excessive snoring and having breathing stop at times while asleep (sleep apnea).   Deepening of the voice.   Gestational diabetes when pregnant.  DIAGNOSIS  There is no single test to diagnose PCOS.   Your health care provider will:   Take a medical history.   Perform a pelvic exam.   Have ultrasonography done.   Check your female and female hormone levels.   Measure glucose or sugar levels in the blood.   Do other  blood tests.   If you are producing too many female hormones, your health care provider will make sure it is from PCOS. At the physical exam, your health care provider will want to evaluate the areas of increased hair growth. Try to allow natural hair growth for a few days before the visit.   During a pelvic exam, the ovaries may be enlarged or swollen because of the increased number of small cysts. This can be seen more easily by using vaginal ultrasonography or screening to examine the ovaries and lining of the uterus (endometrium) for cysts. The uterine lining may become thicker if you have not been having a regular period.  TREATMENT  Because there is no cure for PCOS, it needs to be managed to prevent problems. Treatments are based on your symptoms. Treatment is also based on whether you want to have a baby or whether you need contraception.  Treatment may include:   Progesterone hormone to start a menstrual period.   Birth control pills to make you have regular menstrual periods.   Medicines to make you ovulate, if you want to get pregnant.   Medicines to control your insulin.   Medicine to control your blood pressure.   Medicine and diet to control your high cholesterol and triglycerides in your blood.  Medicine to reduce excessive hair growth.  Surgery, making small holes in the ovary, to decrease the amount of female hormone production. This is  done through a long, lighted tube (laparoscope) placed into the pelvis through a tiny incision in the lower abdomen.  HOME CARE INSTRUCTIONS  Only take over-the-counter or prescription medicine as directed by your health care provider.  Pay attention to the foods you eat and your activity levels. This can help reduce the effects of PCOS.  Keep your weight under control.  Eat foods that are low in carbohydrate and high in fiber.  Exercise regularly. SEEK MEDICAL CARE IF:  Your symptoms do not get better with medicine.  You  have new symptoms. Document Released: 12/30/2004 Document Revised: 06/26/2013 Document Reviewed: 02/21/2013 [facility] Patient Information 2015 [place], [place]. This information is not intended to replace advice given to you by your health care provider. Make sure you discuss any questions you have with your health care provider.

## 2014-05-22 ENCOUNTER — Encounter: Payer: Self-pay | Admitting: *Deleted

## 2014-05-22 LAB — TESTOSTERONE, FREE, TOTAL, SHBG
Sex Hormone Binding: 17 nmol/L — ABNORMAL LOW (ref 18–114)
TESTOSTERONE-% FREE: 2.5 % — AB (ref 0.4–2.4)
Testosterone, Free: 8.8 pg/mL — ABNORMAL HIGH (ref 0.6–6.8)
Testosterone: 35 ng/dL (ref 10–70)

## 2014-05-29 ENCOUNTER — Encounter: Payer: 59 | Attending: Obstetrics and Gynecology | Admitting: *Deleted

## 2014-05-29 DIAGNOSIS — E669 Obesity, unspecified: Secondary | ICD-10-CM | POA: Diagnosis present

## 2014-05-29 DIAGNOSIS — Z713 Dietary counseling and surveillance: Secondary | ICD-10-CM | POA: Insufficient documentation

## 2014-05-29 DIAGNOSIS — E282 Polycystic ovarian syndrome: Secondary | ICD-10-CM | POA: Insufficient documentation

## 2014-05-29 DIAGNOSIS — E785 Hyperlipidemia, unspecified: Secondary | ICD-10-CM | POA: Diagnosis not present

## 2014-05-29 NOTE — Progress Notes (Signed)
  Medical Nutrition Therapy:  Appt start time: 0915 end time:  0945.  Assessment:  Primary concerns today: Rachel Ford was referred for nutrition counseling.  She reports seeing Dr. Elvera Lennox last week for PCOS evaluation.  Dr. Elvera Lennox made changes to her metformin prescription, but she isn't tolerating it well.  She made really positive changes to her diet, but she hasn't been eating healthy since last week because her stomach is so upset.  She is considering stopping the medication due to its side effects.  She also complains of not sleeping well since starting the increased Metformin dose and complains of hallucinations.     Preferred Learning Style:   Visual  Learning Readiness:   Change in progress  MEDICATIONS: see list   DIETARY INTAKE: Usual eating pattern includes 3 meals and 0-2 snacks per day.   24-hr recall:  B ( AM): English muffin with peanut butter.  Sometimes 1 egg and 1 egg white with tomatoes, [place] sausage  Snk ( AM): not likely sometimes greek yogurt L ( PM): salad Snk ( PM): almonds or cheese  D ( PM):steamed vegetables and chicken Snk ( PM): not usually Beverages: water and diet soda  Usual physical activity: walks during lunch 2-3 times a week.    Estimated energy needs: 1800 calories 200 g carbohydrates 135 g protein 50 g fat    Nutritional Diagnosis:  [name]-2.1 Inpaired nutrition utilization As related to carbohydrates.  As evidenced by PCOS and hyperglyemia.    Intervention:  Nutrition counseling provided.  Recommended titration dose for Metformin to minimize GI distress: Take 500 mg Metformin twice a day with food for 2 week; Then take 500 mg and once and then 1000 mg once for 2 weeks; Then take the 1000 mg twice a day.  [name] was very successful with implementing healthy food changes before the Metformin change and her GI distress and sleeping habits worsened.  We agree that if she can tolerate the metformin, then she will eat and sleep better.  I've  instructed her to follow up with her providers if she continues to have issues with her medications.  Recommended sparkling water instead of diet soda and we will discuss further nutrition recommendations at her next visit if she's tolerating food/medication better  Teaching Method Utilized:  Auditory   Barriers to learning/adherence to lifestyle change:GI upset  Demonstrated degree of understanding via:  Teach Back   Monitoring/Evaluation:  Dietary intake, exercise, and body weight in 8 week(s).

## 2014-05-29 NOTE — Patient Instructions (Addendum)
Take 500 mg Metformin twice a day with food for 2 week Then take 500 mg and once and then 1000 mg once for 2 weeks Then take the 1000 mg twice a day  Try sparkling waters like Dasani or [employer]   If your GI symptoms and/or sleep hasn't improved in 6 weeks, call me or primary care physician and talk about higher dose

## 2014-07-21 ENCOUNTER — Encounter: Payer: Self-pay | Admitting: Internal Medicine

## 2014-07-29 ENCOUNTER — Ambulatory Visit: Payer: 59 | Admitting: *Deleted

## 2014-08-25 ENCOUNTER — Ambulatory Visit: Payer: 59 | Admitting: *Deleted

## 2014-11-19 ENCOUNTER — Other Ambulatory Visit: Payer: Self-pay

## 2014-11-19 DIAGNOSIS — Z1231 Encounter for screening mammogram for malignant neoplasm of breast: Secondary | ICD-10-CM

## 2014-12-01 ENCOUNTER — Ambulatory Visit
Admission: RE | Admit: 2014-12-01 | Discharge: 2014-12-01 | Disposition: A | Discharge status: Home / Self Care | Payer: 59 | Source: Ambulatory Visit

## 2014-12-01 DIAGNOSIS — Z1231 Encounter for screening mammogram for malignant neoplasm of breast: Secondary | ICD-10-CM

## 2015-03-25 ENCOUNTER — Other Ambulatory Visit: Payer: Self-pay | Admitting: Sports Medicine

## 2015-03-25 DIAGNOSIS — M542 Cervicalgia: Secondary | ICD-10-CM

## 2015-04-10 ENCOUNTER — Ambulatory Visit
Admission: RE | Admit: 2015-04-10 | Discharge: 2015-04-10 | Disposition: A | Discharge status: Home / Self Care | Payer: 59 | Source: Ambulatory Visit | Attending: Sports Medicine | Admitting: Sports Medicine

## 2015-04-10 DIAGNOSIS — M542 Cervicalgia: Secondary | ICD-10-CM

## 2015-05-04 ENCOUNTER — Other Ambulatory Visit: Payer: Self-pay | Admitting: Internal Medicine

## 2015-05-04 DIAGNOSIS — E010 Iodine-deficiency related diffuse (endemic) goiter: Secondary | ICD-10-CM

## 2015-05-07 ENCOUNTER — Other Ambulatory Visit: Payer: 59

## 2015-05-18 ENCOUNTER — Ambulatory Visit
Admission: RE | Admit: 2015-05-18 | Discharge: 2015-05-18 | Disposition: A | Discharge status: Home / Self Care | Payer: 59 | Source: Ambulatory Visit | Attending: Internal Medicine | Admitting: Internal Medicine

## 2015-05-18 ENCOUNTER — Encounter (INDEPENDENT_AMBULATORY_CARE_PROVIDER_SITE_OTHER): Payer: Self-pay

## 2015-05-18 DIAGNOSIS — E010 Iodine-deficiency related diffuse (endemic) goiter: Secondary | ICD-10-CM

## 2015-05-19 ENCOUNTER — Other Ambulatory Visit: Payer: 59

## 2015-06-23 ENCOUNTER — Ambulatory Visit (INDEPENDENT_AMBULATORY_CARE_PROVIDER_SITE_OTHER): Payer: 59 | Admitting: Neurology

## 2015-06-23 ENCOUNTER — Ambulatory Visit (INDEPENDENT_AMBULATORY_CARE_PROVIDER_SITE_OTHER): Payer: Self-pay | Admitting: Neurology

## 2015-06-23 DIAGNOSIS — R202 Paresthesia of skin: Secondary | ICD-10-CM | POA: Diagnosis not present

## 2015-06-23 DIAGNOSIS — Z0289 Encounter for other administrative examinations: Secondary | ICD-10-CM

## 2015-06-23 NOTE — Procedures (Signed)
   NCS (NERVE CONDUCTION STUDY) WITH EMG (ELECTROMYOGRAPHY) REPORT   STUDY DATE: June 23 2015 PATIENT NAME: Rachel Ford DOB: [birth year] MRN: [id number]    TECHNOLOGIST: Gearldine Shown ELECTROMYOGRAPHER: Levert Feinstein M.D.  CLINICAL INFORMATION:  44 years old female, with intermittent bilateral fourth and fifth finger paresthesia, left worse than right.  FINDINGS: NERVE CONDUCTION STUDY: Left ulnar, median sensory and motor responses were normal.  NEEDLE ELECTROMYOGRAPHY: Selected needle examinations were performed at left upper extremity muscles, and left cervical paraspinal muscles.  Needle examination of left pronator teres, biceps, triceps, deltoid, extensor digitorum communis, first dorsal interossei was normal.  There was no spontaneous activity at left cervical paraspinal muscles, left C5-6 and 7.  IMPRESSION:  This is a normal study. There is no electrodiagnostic evidence of left upper extremity neuropathy, or left cervical radiculopathy.   INTERPRETING PHYSICIAN:   Levert Feinstein M.D. Ph.D. [facility] [address], [address] [place], [place] [address] [phone]

## 2015-06-23 NOTE — Progress Notes (Signed)
Electrodiagnostic study is normal, there is no evidence of left upper extremity neuropathy, or left cervical radiculopathy.

## 2015-09-03 IMAGING — US US SOFT TISSUE HEAD/NECK
1 series · 14 of 25 positions shown · non-contrast
Comparison: None.

CLINICAL DATA: Thyromegaly.

EXAM:
THYROID ULTRASOUND
TECHNIQUE: Ultrasound examination of the thyroid gland and adjacent soft
tissues was performed.

[Series 1: us soft tissue head/neck · 0.06mm/px · 14 of 31 slices shown]
[im 1/31]
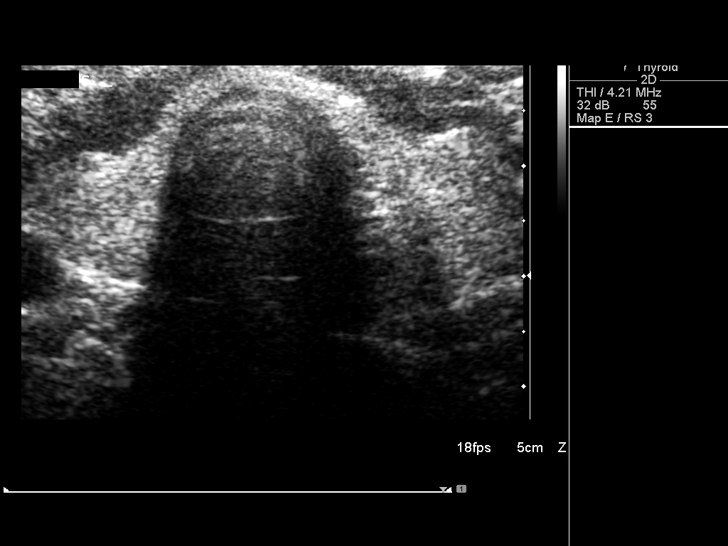
[im 3/31]
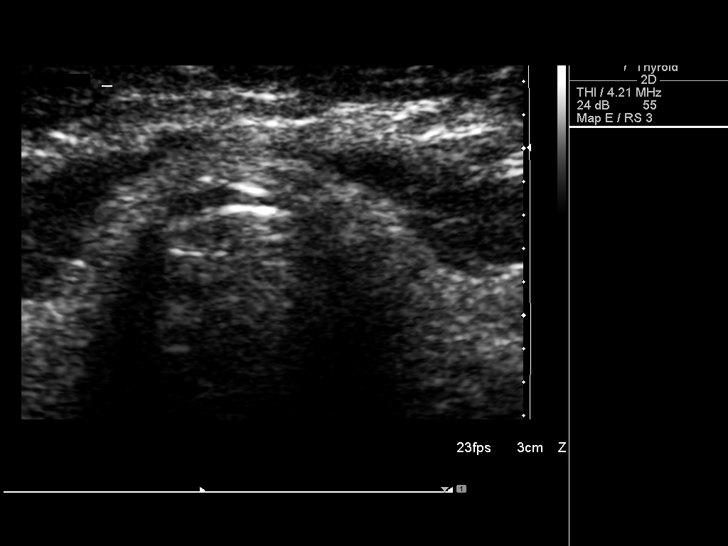
[im 6/31]
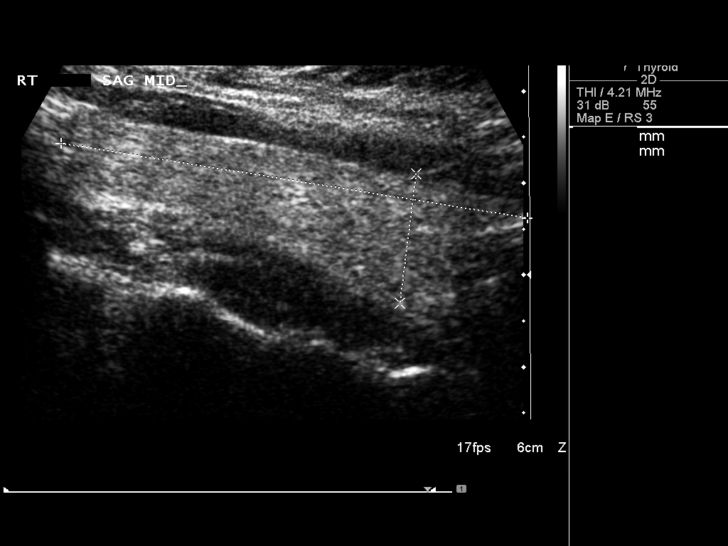
[im 8/31]
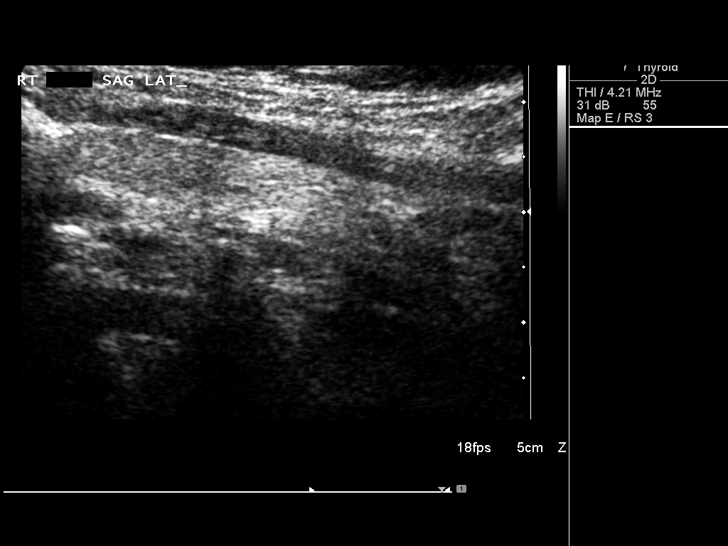
[im 11/31]
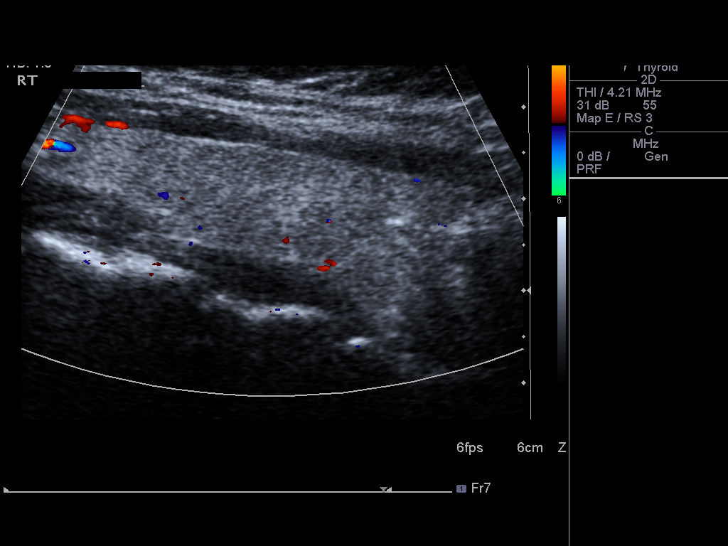
[im 12/31]
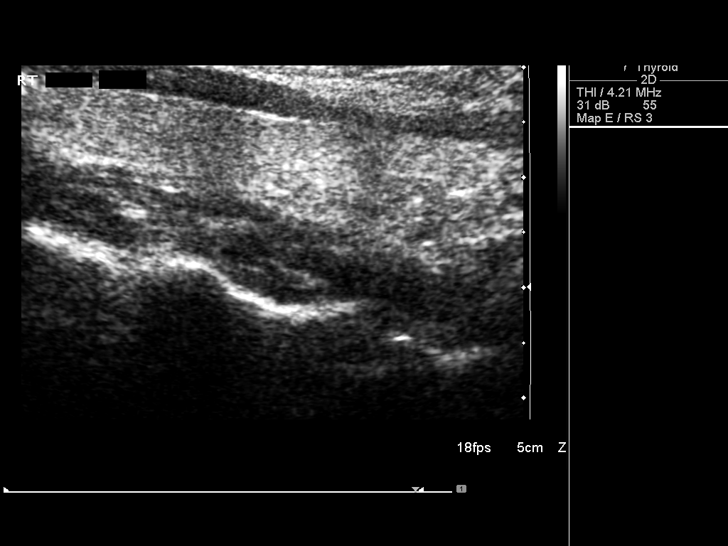
[im 14/31]
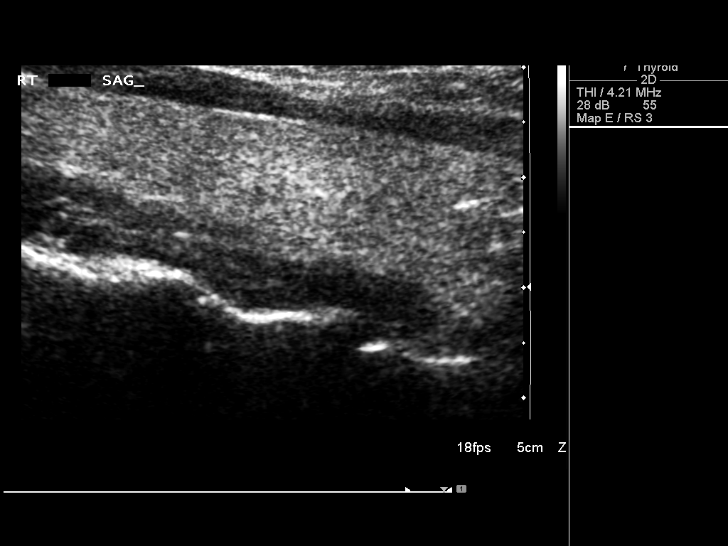
[im 17/31]
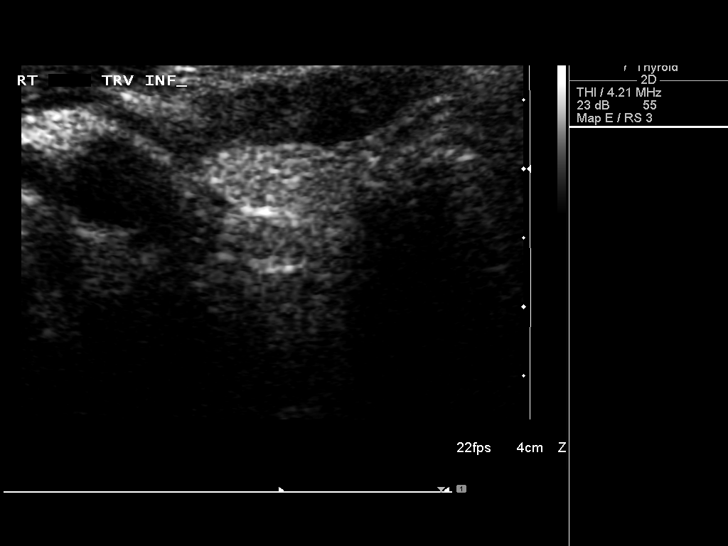
[im 19/31]
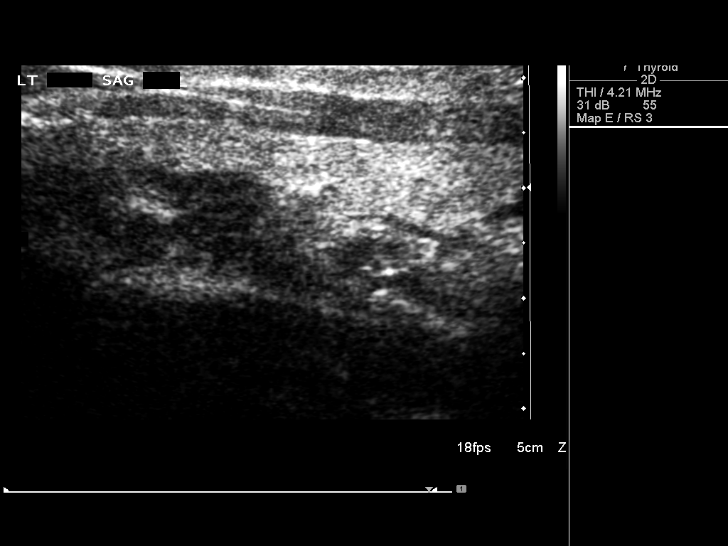
[im 21/31]
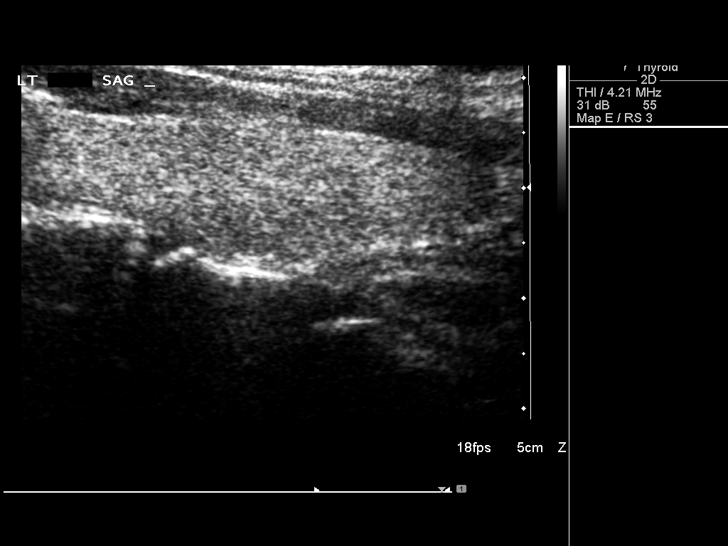
[im 23/31]
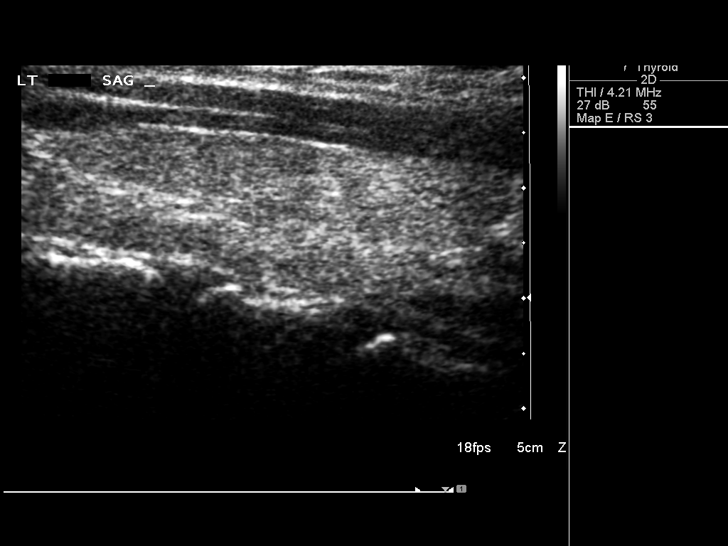
[im 26/31]
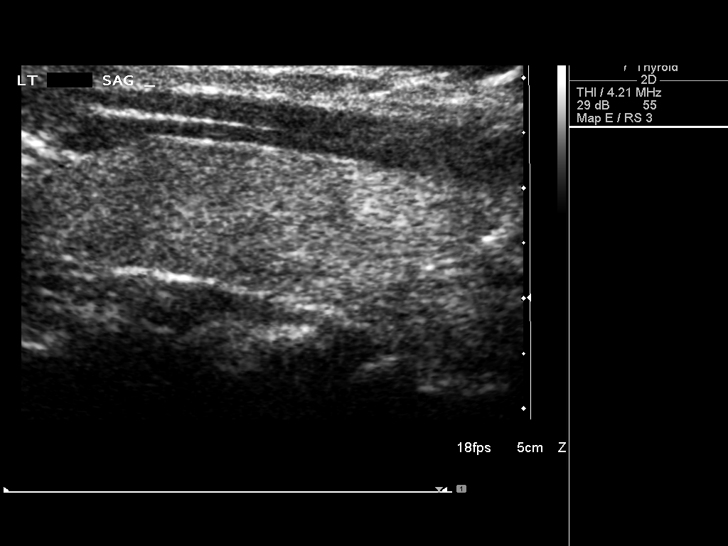
[im 28/31]
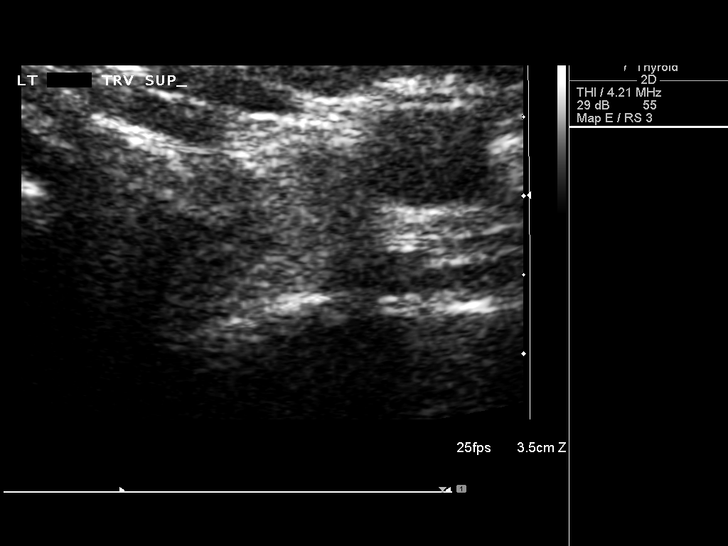
[im 31/31]
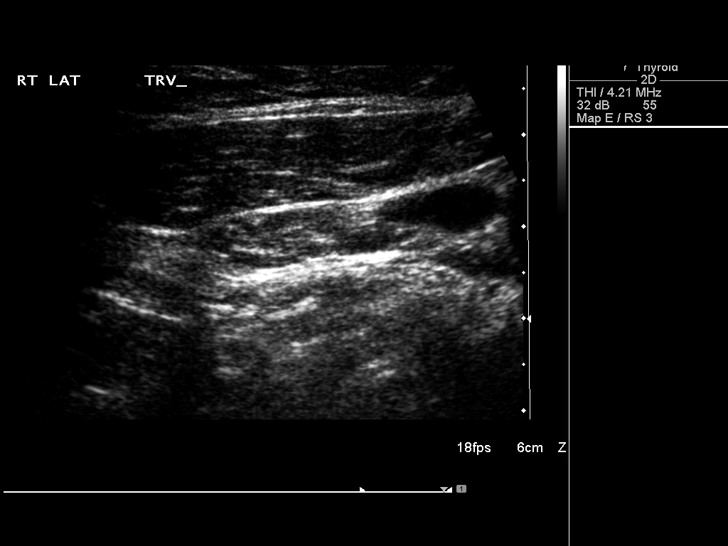

[14 of 25 positions shown; findings below may reference images not displayed]

FINDINGS: There is relative homogeneity of the thyroid parenchymal
echotexture.

Right thyroid lobe

Measurements: Normal in size measuring 5.1 x 1.4 x 1.3 cm.

No discrete nodule or mass identified within the right lobe of the
thyroid.

Left thyroid lobe

Measurements: Normal in size measuring 4.7 x 1.2 x 1.1 cm.

No discrete nodule or mass identified within the left lobe of the
thyroid.

Isthmus

Thickness: Normal in size measures 0.2 cm in diameter.

No discrete nodule or mass identified within the thyroid isthmus.

Lymphadenopathy

None visualized.
IMPRESSION: Normal thyroid ultrasound. Specifically, no evidence of thyromegaly
or discrete thyroid nodule or mass.

## 2015-10-28 ENCOUNTER — Other Ambulatory Visit: Payer: Self-pay | Admitting: Internal Medicine

## 2015-10-28 DIAGNOSIS — K219 Gastro-esophageal reflux disease without esophagitis: Secondary | ICD-10-CM

## 2015-10-28 DIAGNOSIS — R112 Nausea with vomiting, unspecified: Secondary | ICD-10-CM

## 2015-10-29 ENCOUNTER — Ambulatory Visit
Admission: RE | Admit: 2015-10-29 | Discharge: 2015-10-29 | Disposition: A | Discharge status: Home / Self Care | Payer: 59 | Source: Ambulatory Visit | Attending: Internal Medicine | Admitting: Internal Medicine

## 2015-10-29 ENCOUNTER — Encounter (INDEPENDENT_AMBULATORY_CARE_PROVIDER_SITE_OTHER): Payer: Self-pay

## 2015-10-29 DIAGNOSIS — K219 Gastro-esophageal reflux disease without esophagitis: Secondary | ICD-10-CM

## 2015-10-29 DIAGNOSIS — R112 Nausea with vomiting, unspecified: Secondary | ICD-10-CM

## 2015-11-04 ENCOUNTER — Encounter: Payer: Self-pay | Admitting: Gastroenterology

## 2016-01-04 ENCOUNTER — Ambulatory Visit: Payer: Managed Care, Other (non HMO) | Admitting: Gastroenterology

## 2016-09-20 ENCOUNTER — Encounter (HOSPITAL_COMMUNITY): Payer: Self-pay

## 2016-09-20 ENCOUNTER — Emergency Department (HOSPITAL_COMMUNITY)
Admission: EM | Admit: 2016-09-20 | Discharge: 2016-09-21 | Disposition: A | Discharge status: Home / Self Care | Payer: Managed Care, Other (non HMO) | Attending: Emergency Medicine | Admitting: Emergency Medicine

## 2016-09-20 ENCOUNTER — Emergency Department (HOSPITAL_COMMUNITY): Payer: Managed Care, Other (non HMO)

## 2016-09-20 DIAGNOSIS — R1011 Right upper quadrant pain: Secondary | ICD-10-CM | POA: Diagnosis present

## 2016-09-20 DIAGNOSIS — Z7984 Long term (current) use of oral hypoglycemic drugs: Secondary | ICD-10-CM | POA: Diagnosis not present

## 2016-09-20 DIAGNOSIS — K8020 Calculus of gallbladder without cholecystitis without obstruction: Secondary | ICD-10-CM | POA: Diagnosis not present

## 2016-09-20 DIAGNOSIS — Z79899 Other long term (current) drug therapy: Secondary | ICD-10-CM | POA: Diagnosis not present

## 2016-09-20 LAB — URINALYSIS, ROUTINE W REFLEX MICROSCOPIC
GLUCOSE, UA: NEGATIVE mg/dL
KETONES UR: NEGATIVE mg/dL
LEUKOCYTES UA: NEGATIVE
NITRITE: NEGATIVE
PH: 5 (ref 5.0–8.0)
Protein, ur: NEGATIVE mg/dL
SPECIFIC GRAVITY, URINE: 1.021 (ref 1.005–1.030)
Squamous Epithelial / LPF: NONE SEEN

## 2016-09-20 LAB — CBC
HCT: 40.9 % (ref 36.0–46.0)
Hemoglobin: 13.3 g/dL (ref 12.0–15.0)
MCH: 29.4 pg (ref 26.0–34.0)
MCHC: 32.5 g/dL (ref 30.0–36.0)
MCV: 90.3 fL (ref 78.0–100.0)
PLATELETS: 352 10*3/uL (ref 150–400)
RBC: 4.53 MIL/uL (ref 3.87–5.11)
RDW: 13.7 % (ref 11.5–15.5)
WBC: 6.3 10*3/uL (ref 4.0–10.5)

## 2016-09-20 LAB — COMPREHENSIVE METABOLIC PANEL
ALT: 377 U/L — ABNORMAL HIGH (ref 14–54)
AST: 179 U/L — ABNORMAL HIGH (ref 15–41)
Albumin: 3.8 g/dL (ref 3.5–5.0)
Alkaline Phosphatase: 190 U/L — ABNORMAL HIGH (ref 38–126)
Anion gap: 7 (ref 5–15)
BILIRUBIN TOTAL: 2.8 mg/dL — AB (ref 0.3–1.2)
BUN: 8 mg/dL (ref 6–20)
CHLORIDE: 108 mmol/L (ref 101–111)
CO2: 23 mmol/L (ref 22–32)
CREATININE: 0.7 mg/dL (ref 0.44–1.00)
Calcium: 9.1 mg/dL (ref 8.9–10.3)
Glucose, Bld: 107 mg/dL — ABNORMAL HIGH (ref 65–99)
POTASSIUM: 4.3 mmol/L (ref 3.5–5.1)
Sodium: 138 mmol/L (ref 135–145)
TOTAL PROTEIN: 7.3 g/dL (ref 6.5–8.1)

## 2016-09-20 LAB — LIPASE, BLOOD: LIPASE: 33 U/L (ref 11–51)

## 2016-09-20 LAB — I-STAT CG4 LACTIC ACID, ED: Lactic Acid, Venous: 0.89 mmol/L (ref 0.5–1.9)

## 2016-09-20 LAB — I-STAT BETA HCG BLOOD, ED (MC, WL, AP ONLY): I-stat hCG, quantitative: <5 m[IU]/mL (ref <5)

## 2016-09-20 MED ORDER — OXYCODONE-ACETAMINOPHEN 5-325 MG PO TABS: 1.0000 | ORAL_TABLET | Freq: Four times a day (QID) | ORAL | 0 refills | Status: DC | PRN

## 2016-09-20 MED ORDER — ONDANSETRON 4 MG PO TBDP
4.0000 mg | ORAL_TABLET | Freq: Once | ORAL | Status: DC
  Filled 2016-09-20: qty 1

## 2016-09-20 MED ORDER — OXYCODONE-ACETAMINOPHEN 5-325 MG PO TABS
1.0000 | ORAL_TABLET | Freq: Once | ORAL | Status: DC
  Filled 2016-09-20: qty 1

## 2016-09-20 MED ORDER — ONDANSETRON 4 MG PO TBDP: 4.0000 mg | ORAL_TABLET | Freq: Three times a day (TID) | ORAL | 0 refills | Status: DC | PRN

## 2016-09-20 NOTE — ED Notes (Addendum)
Pt to nurse first stating she is going to leave. Pt updated on wait and encouraged to stay. Pt agreeable at this time.

## 2016-09-20 NOTE — Discharge Instructions (Signed)
Ultrasound shows that you have gallstones without any gallbladder wall thickening. Please make sure you keep your appointment for your hiatus scan is scheduled. You have been given a prescription for Zofran and Percocet to help control your symptoms of nausea and pain. Continue to follow a bland diet

## 2016-09-20 NOTE — ED Notes (Signed)
Pt stable, ambulatory, states understanding of discharge instructions 

## 2016-09-20 NOTE — ED Triage Notes (Signed)
Pt presents for evaluation of RUQ pain x3 days. Pt reports hx of gallbladder attack, reports pain on Saturday PM with N/V. N/V has since resolved but pain has continued. Pt denies diarrhea. Pt reports had perirectal abscess drained on Tuesday.

## 2016-09-20 NOTE — ED Provider Notes (Signed)
[facility]-EMERGENCY DEPT Provider Note   CSN: [id number] Arrival date & time: 09/20/16  1444     History   Chief Complaint Chief Complaint  Patient presents with  . Abdominal Pain    HPI Rachel Ford is a 46 y.o. female.  This is a obese female with a known history of gallstones diagnosed February 2017 by ultrasound.  She has followed up with gastroenterology and she is scheduled for a hydroscan in a week and a half.  She states on Saturday night she had a gallbladder attack which lasted several hours with nausea and vomiting and since that time she's had continued right upper quadrant pain.  She states it feels like somebody is got a rope around her upper abdomen tightening in spasmatic, rheumatic pattern.  She's had nausea but no vomiting.  She's been eating very lightly.  She did call her gastroenterologist.  She could not see her today.  She called her primary care physician who could not see her today, both recommended that she come to the emergency department for evaluation.      Past Medical History:  Diagnosis Date  . Depression    post partum  . Factor 5 Leiden mutation, heterozygous (HCC) 09/20/2012  . Hyperlipidemia   . Lumbar herniated disc   . Menorrhagia   . OCD (obsessive compulsive disorder)   . PCOS (polycystic ovarian syndrome)     Patient Active Problem List   Diagnosis Date Noted  . Factor 5 Leiden mutation, heterozygous (HCC) 09/20/2012  . PCOS (polycystic ovarian syndrome)   . Lumbar herniated disc     Past Surgical History:  Procedure Laterality Date  . CESAREAN SECTION    . herniated disc surgery    . WISDOM TOOTH EXTRACTION      OB History    Gravida Para Term Preterm AB Living   3 2       2    SAB TAB Ectopic Multiple Live Births                   Home Medications    Prior to Admission medications   Medication Sig Start Date End Date Taking? Authorizing Provider  simethicone (MYLICON) 80 MG chewable tablet Chew 80 mg by mouth every 6  (six) hours as needed for flatulence.   Yes Historical Provider, MD  sulfamethoxazole-trimethoprim (BACTRIM DS,SEPTRA DS) 800-160 MG tablet Take 1 tablet by mouth 2 (two) times daily. 09/16/16  Yes Historical Provider, MD  metFORMIN (GLUCOPHAGE) 1000 MG tablet Take 1 tablet (1,000 mg total) by mouth 2 (two) times daily with a meal. Patient not taking: Reported on 09/20/2016 05/21/14   Carlus Pavlovristina Gherghe, MD  ondansetron (ZOFRAN ODT) 4 MG disintegrating tablet Take 1 tablet (4 mg total) by mouth every 8 (eight) hours as needed for nausea or vomiting. 09/20/16   Earley FavorGail Demarian Epps, NP  oxyCODONE-acetaminophen (PERCOCET/ROXICET) 5-325 MG tablet Take 1 tablet by mouth every 6 (six) hours as needed for severe pain. 09/20/16   Earley FavorGail Bryant Lipps, NP    Family History Family History  Problem Relation Age of Onset  . Cancer Other     melanoma   . Hypertension Father     Social History Social History  Substance Use Topics  . Smoking status: Never Smoker  . Smokeless tobacco: Never Used  . Alcohol use Yes     Comment: ocaasional wine and liquor     Allergies   Codeine   Review of Systems Review of Systems  Constitutional: Positive for  appetite change. Negative for chills and fever.  Gastrointestinal: Positive for abdominal distention, abdominal pain and nausea. Negative for constipation, diarrhea and vomiting.  All other systems reviewed and are negative.    Physical Exam Updated Vital Signs BP 138/82 (BP Location: Right Arm)   Pulse 105   Temp 98.4 F (36.9 C) (Oral)   Resp 16   Ht 5\' 7"  (1.702 m)   Wt 99.8 kg   LMP 09/11/2016   SpO2 99%   BMI 34.46 kg/m   Physical Exam  Constitutional: She appears well-developed and well-nourished. No distress.  HENT:  Head: Normocephalic.  Eyes: Pupils are equal, round, and reactive to light.  Neck: Normal range of motion.  Cardiovascular: Normal rate and regular rhythm.   Pulmonary/Chest: Effort normal.  Abdominal: Soft. Bowel sounds are normal. She  exhibits distension. She exhibits no mass. There is tenderness. There is no guarding.  Musculoskeletal: Normal range of motion.  Neurological: She is alert.  Skin: Skin is warm.  Nursing note and vitals reviewed.    ED Treatments / Results  Labs (all labs ordered are listed, but only abnormal results are displayed) Labs Reviewed  COMPREHENSIVE METABOLIC PANEL - Abnormal; Notable for the following:       Result Value   Glucose, Bld 107 (*)    AST 179 (*)    ALT 377 (*)    Alkaline Phosphatase 190 (*)    Total Bilirubin 2.8 (*)    All other components within normal limits  URINALYSIS, ROUTINE W REFLEX MICROSCOPIC - Abnormal; Notable for the following:    Color, Urine AMBER (*)    Hgb urine dipstick MODERATE (*)    Bilirubin Urine MODERATE (*)    Bacteria, UA RARE (*)    All other components within normal limits  LIPASE, BLOOD  CBC  I-STAT BETA HCG BLOOD, ED ([facility], [facility], [facility] ONLY)  I-STAT CG4 LACTIC ACID, ED  I-STAT CG4 LACTIC ACID, ED    EKG  EKG Interpretation None       Radiology US Abdomen Limited  Result Date: 09/20/2016 CLINICAL DATA:  Right upper quadrant pain for 3 days. Vomiting and nausea. EXAM: US ABDOMEN LIMITED - RIGHT UPPER QUADRANT COMPARISON:  None. FINDINGS: Gallbladder: Multiple small calculi are present within the gallbladder lumen, as well as a small volume intraluminal sludge. The stones measure only a few mm each. No pericholecystic fluid. No gallbladder mural thickening. No tenderness over the gallbladder. Common bile duct: Diameter: 4 mm Liver: No focal lesion identified. Within normal limits in parenchymal echogenicity. IMPRESSION: Cholelithiasis without sonographic evidence of acute cholecystitis. Electronically Signed   By: Ellery Plunk M.D.   On: 09/20/2016 22:30    Procedures Procedures (including critical care time)  Medications Ordered in ED Medications  oxyCODONE-acetaminophen (PERCOCET/ROXICET) 5-325 MG per tablet 1 tablet (1 tablet Oral  Refused 09/20/16 2039)  ondansetron (ZOFRAN-ODT) disintegrating tablet 4 mg (4 mg Oral Refused 09/20/16 2039)     Initial Impression / Assessment and Plan / ED Course  I have reviewed the triage vital signs and the nursing notes.  Pertinent labs & imaging results that were available during my care of the patient were reviewed by me and considered in my medical decision making (see chart for details).  Clinical Course      Ultrasound shows gallstones without cholecystitis.  Patient was given prescription for pain control and antiemetic.  She will continue seeing GI.  She is scheduled for a hiatus scan in a week  Final Clinical  Impressions(s) / ED Diagnoses   Final diagnoses:  Calculus of gallbladder without cholecystitis without obstruction    New Prescriptions New Prescriptions   ONDANSETRON (ZOFRAN ODT) 4 MG DISINTEGRATING TABLET    Take 1 tablet (4 mg total) by mouth every 8 (eight) hours as needed for nausea or vomiting.   OXYCODONE-ACETAMINOPHEN (PERCOCET/ROXICET) 5-325 MG TABLET    Take 1 tablet by mouth every 6 (six) hours as needed for severe pain.     Earley Favor, NP 09/20/16 8119    Vanetta Mulders, MD 09/24/16 [phone]

## 2016-09-29 ENCOUNTER — Ambulatory Visit (HOSPITAL_COMMUNITY)
Admission: RE | Admit: 2016-09-29 | Discharge: 2016-09-29 | Disposition: A | Discharge status: Home / Self Care | Payer: Managed Care, Other (non HMO) | Source: Ambulatory Visit | Attending: Gastroenterology | Admitting: Gastroenterology

## 2016-09-29 ENCOUNTER — Other Ambulatory Visit (HOSPITAL_COMMUNITY): Payer: Self-pay | Admitting: Gastroenterology

## 2016-09-29 DIAGNOSIS — K8001 Calculus of gallbladder with acute cholecystitis with obstruction: Secondary | ICD-10-CM

## 2016-09-29 DIAGNOSIS — R748 Abnormal levels of other serum enzymes: Secondary | ICD-10-CM

## 2016-09-29 DIAGNOSIS — K8020 Calculus of gallbladder without cholecystitis without obstruction: Secondary | ICD-10-CM | POA: Insufficient documentation

## 2016-10-12 ENCOUNTER — Other Ambulatory Visit: Payer: Self-pay | Admitting: Internal Medicine

## 2016-10-12 DIAGNOSIS — N281 Cyst of kidney, acquired: Secondary | ICD-10-CM

## 2016-10-16 ENCOUNTER — Ambulatory Visit
Admission: RE | Admit: 2016-10-16 | Discharge: 2016-10-16 | Disposition: A | Discharge status: Home / Self Care | Payer: Managed Care, Other (non HMO) | Source: Ambulatory Visit | Attending: Internal Medicine | Admitting: Internal Medicine

## 2016-10-16 DIAGNOSIS — N281 Cyst of kidney, acquired: Secondary | ICD-10-CM

## 2016-10-16 MED ORDER — GADOBENATE DIMEGLUMINE 529 MG/ML IV SOLN
20.0000 mL | Freq: Once | INTRAVENOUS | Status: AC | PRN
  Administered 2016-10-16: 20 mL via INTRAVENOUS

## 2016-10-17 ENCOUNTER — Encounter: Payer: Self-pay | Admitting: Internal Medicine

## 2017-02-20 ENCOUNTER — Other Ambulatory Visit: Payer: Self-pay | Admitting: Orthopedic Surgery

## 2017-02-20 DIAGNOSIS — M5416 Radiculopathy, lumbar region: Secondary | ICD-10-CM

## 2017-02-23 ENCOUNTER — Ambulatory Visit
Admission: RE | Admit: 2017-02-23 | Discharge: 2017-02-23 | Disposition: A | Discharge status: Home / Self Care | Payer: Managed Care, Other (non HMO) | Source: Ambulatory Visit | Attending: Orthopedic Surgery | Admitting: Orthopedic Surgery

## 2017-02-23 DIAGNOSIS — M5416 Radiculopathy, lumbar region: Secondary | ICD-10-CM

## 2017-04-04 ENCOUNTER — Ambulatory Visit (INDEPENDENT_AMBULATORY_CARE_PROVIDER_SITE_OTHER): Payer: 59 | Admitting: Oncology

## 2017-04-04 ENCOUNTER — Encounter: Payer: Self-pay | Admitting: Oncology

## 2017-04-04 VITALS — BP 145/81 | HR 70 | Temp 98.1°F | Ht 67.0 in | Wt 242.1 lb

## 2017-04-04 DIAGNOSIS — D6851 Activated protein C resistance: Secondary | ICD-10-CM

## 2017-04-04 NOTE — Patient Instructions (Signed)
Return as needed

## 2017-04-04 NOTE — Progress Notes (Signed)
New Patient Hematology   Rachel Ford [id number] 11-26-1970 46 y.o. 04/04/2017  CC: Dr. Pearson GrippeJames Ford; Dr. Dois DavenportSandra Ford   Reason for referral:  Known heterozygote for factor V Leiden gene mutation Advise patient  HPI:  46 year old woman who has been in overall good health.  She has been followed by Dr. Silverio LaySandra Ford, OB/GYN for many years.  She was initially seen back in November 2013.  She was having issues with postpartum depression and an irregular menstrual cycle.  She gave a history of acute visual changes at age approximately [age] when she was on a brief three-week trial of an oral contraceptive.  In addition, there is a family history of blood clots in her father with a lower extremity DVT at approximately age 46.  Questionable history of the DVT in her mother as well.  No blood clots in her siblings who are all younger than her. Hypercoagulation panel was done at that time and revealed that she is a heterozygote for the factor V Leiden gene mutation.  She tested negative for presence of antiphospholipid antibodies, lupus anticoagulant, prothrombin gene mutation, protein S, protein C, or Antithrombin deficiencies. Despite her heterozygote status, she has passed a number of stress tests over her lifetime and has never developed a thrombotic event.  She had 3 pregnancies.  The first and the third carried to term.  The second resulted in a miscarriage.  She had C-sections with both of the term pregnancies.  She had a laminectomy at L5-S1 in 2005 for back problems.  She had her gallbladder out in February 2018.  She has polycystic ovary syndrome which can be associated with acute and chronic inflammation.  She has not received any prophylactic anticoagulation to her knowledge.  She is currently using cyclic low dose oral progesterone to control her menses.  PMH: Past Medical History:  Diagnosis Date  . Depression    post partum  . Factor 5 Leiden mutation, heterozygous (HCC) 09/20/2012  .  Hyperlipidemia   . Lumbar herniated disc   . Menorrhagia   . OCD (obsessive compulsive disorder)   . PCOS (polycystic ovarian syndrome)     Past Surgical History:  Procedure Laterality Date  . CESAREAN SECTION    . herniated disc surgery    . WISDOM TOOTH EXTRACTION      Allergies: Allergies  Allergen Reactions  . Codeine     Medications: Only medication at this time is as needed Flexeril.   Social History: Married.  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  2 children a girl 8 and a son 6.  Son currently under evaluation for unexplained intermittent fevers. She has never smoked. She has never used smokeless tobacco.  She drinks alcohol.  Alcohol consumption limited to a few glasses of wine at social events.  She does not use drugs.  Family History: See above. Family History  Problem Relation Age of Onset  . Cancer Other        melanoma   . Hypertension Father   Brother age 46, sister 7626, sister 4521, all healthy  Review of Systems: She gets an occasional headache.  Relieved with ibuprofen. She is starting to have recurrent back pain and radicular pain down her left leg.  She may need a spinal fusion procedure in the near future. She has gained 35 pounds since her gallbladder was taken out in February which is distressing her. Remaining ROS negative.  Physical Exam: Blood pressure (!) 145/81, pulse 70, temperature  98.1 F (36.7 C), temperature source Oral, height 5\' 7"  (1.702 m), weight 242 lb 1.6 oz (109.8 kg), SpO2 99 %. Wt Readings from Last 3 Encounters:  04/04/17 242 lb 1.6 oz (109.8 kg)  09/20/16 220 lb (99.8 kg)  05/21/14 264 lb 6.4 oz (119.9 kg)     General appearance: Well-nourished Caucasian woman HENNT: Pharynx no erythema, exudate, mass, or ulcer. No thyromegaly or thyroid nodules Lymph nodes: No cervical, supraclavicular, or axillary lymphadenopathy Breasts: Lungs: Clear to auscultation, resonant to percussion throughout Heart: Regular  rhythm, no murmur, no gallop, no rub, no click, no edema Abdomen: Soft, nontender, normal bowel sounds, no mass, no organomegaly Extremities: No edema, no calf tenderness Musculoskeletal: no joint deformities GU:  Vascular: Carotid pulses 2+, no bruits, distal pulses: Dorsalis pedis 1+ left foot, 2+ right foot Neurologic: Alert, oriented, PERRLA, optic discs sharp and vessels normal, no hemorrhage or exudate, cranial nerves grossly normal, motor strength 5 over 5, reflexes 1+ symmetric at the biceps, trace to absent at the patellae upper body coordination normal, gait normal, Skin: No rash or ecchymosis    Lab Results: Lab Results  Component Value Date   WBC 6.3 09/20/2016   HGB 13.3 09/20/2016   HCT 40.9 09/20/2016   MCV 90.3 09/20/2016   PLT 352 09/20/2016     Chemistry      Component Value Date/Time   NA 138 09/20/2016 1500   K 4.3 09/20/2016 1500   CL 108 09/20/2016 1500   CO2 23 09/20/2016 1500   BUN 8 09/20/2016 1500   CREATININE 0.70 09/20/2016 1500      Component Value Date/Time   CALCIUM 9.1 09/20/2016 1500   ALKPHOS 190 (H) 09/20/2016 1500   AST 179 (H) 09/20/2016 1500   ALT 377 (H) 09/20/2016 1500   BILITOT 2.8 (H) 09/20/2016 1500         Radiological Studies: No results found.    Impression: Heterozygote status for the factor V Leiden gene mutation   Recommendation: Despite multiple surgeries and 3 pregnancies in the past, she has never had a thrombotic event. We discussed that we have learned a lot since the Leiden mutation was first described in 1973.  Although there is a relative increased clotting risk of 5-7 fold over normal, and this risk is increased on estrogen contraceptives, the absolute risk is still quite low on the order of 3 or 4 additional thrombotic events per 1000  per year if  on estrogen and only 6 in 10,000 events in patients not on estrogens compared with someone without this mutation. At present, anybody who comes into the  hospital who does not have a contraindication gets put on prophylactic anticoagulation.  The exception is perioperative anticoagulation around spinal and cranial procedures where it is common to use mechanical prophylaxis for the first 2 or 3 days before the addition of prophylactic anticoagulants to avoid any paraspinal or intracranial bleeding. There is still no routine prophylactic anticoagulant or antiplatelet agent recommendation for a low risk person such as this patient. One could consider prophylaxis for air travel lasting more than 12 hours. She is OK to continue low dose progesterone preparations.         Cephas Darby, MD, FACP  Hematology-Oncology/Internal Medicine  04/04/2017, 3:36 PM

## 2017-06-13 ENCOUNTER — Other Ambulatory Visit: Payer: Self-pay | Admitting: Neurological Surgery

## 2017-06-13 DIAGNOSIS — M5416 Radiculopathy, lumbar region: Secondary | ICD-10-CM

## 2017-06-27 ENCOUNTER — Ambulatory Visit
Admission: RE | Admit: 2017-06-27 | Discharge: 2017-06-27 | Disposition: A | Discharge status: Home / Self Care | Payer: 59 | Source: Ambulatory Visit | Attending: Neurological Surgery | Admitting: Neurological Surgery

## 2017-06-27 DIAGNOSIS — M5416 Radiculopathy, lumbar region: Secondary | ICD-10-CM

## 2017-06-27 MED ORDER — GADOBENATE DIMEGLUMINE 529 MG/ML IV SOLN
20.0000 mL | Freq: Once | INTRAVENOUS | Status: AC | PRN
  Administered 2017-06-27: 20 mL via INTRAVENOUS

## 2018-02-07 ENCOUNTER — Other Ambulatory Visit: Payer: Self-pay | Admitting: Sports Medicine

## 2018-02-07 DIAGNOSIS — M25571 Pain in right ankle and joints of right foot: Secondary | ICD-10-CM

## 2018-02-15 ENCOUNTER — Ambulatory Visit
Admission: RE | Admit: 2018-02-15 | Discharge: 2018-02-15 | Disposition: A | Discharge status: Home / Self Care | Payer: 59 | Source: Ambulatory Visit | Attending: Sports Medicine | Admitting: Sports Medicine

## 2018-02-15 DIAGNOSIS — M25571 Pain in right ankle and joints of right foot: Secondary | ICD-10-CM

## 2018-06-03 IMAGING — MR MR ANKLE*R* W/O CM
3 of 5 series · 8 of 40 positions shown · non-contrast
Comparison: None.

CLINICAL DATA: Posterior ankle pain for 3 months.

EXAM:
MRI OF THE RIGHT ANKLE WITHOUT CONTRAST
TECHNIQUE: Multiplanar, multisequence MR imaging of the ankle was performed. No
intravenous contrast was administered.

[Series 3: T1 · sagittal · right · 2.5mm · 0.18mm/px · 2 of 31 slices shown]
[im 5/31]
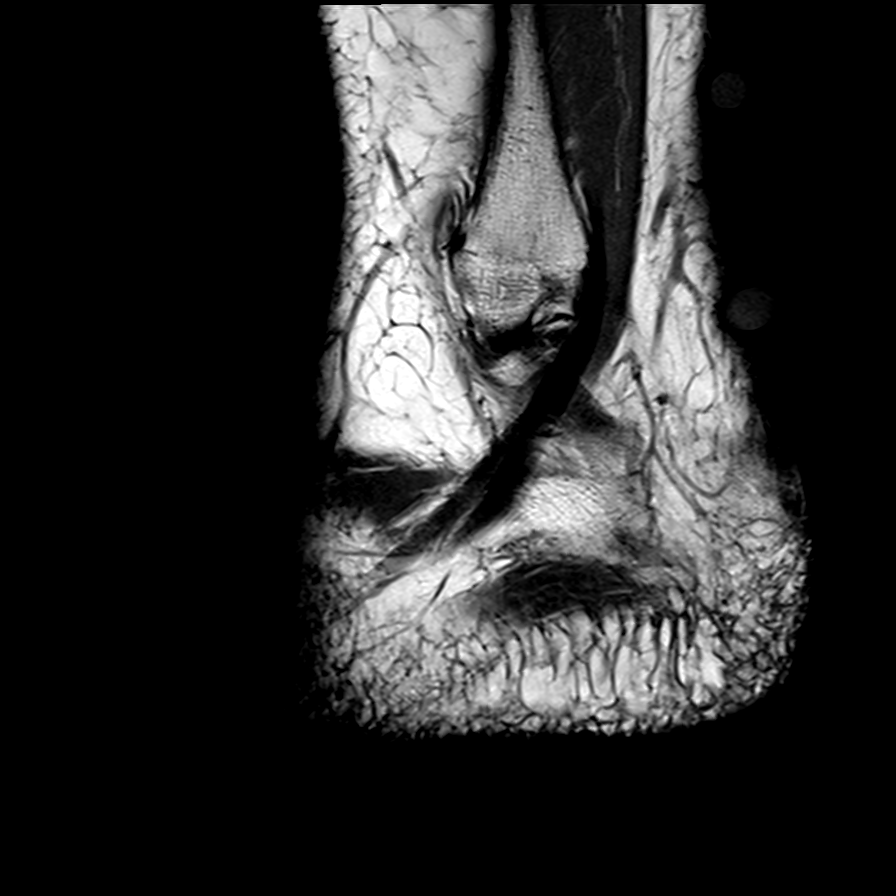
[im 18/31]
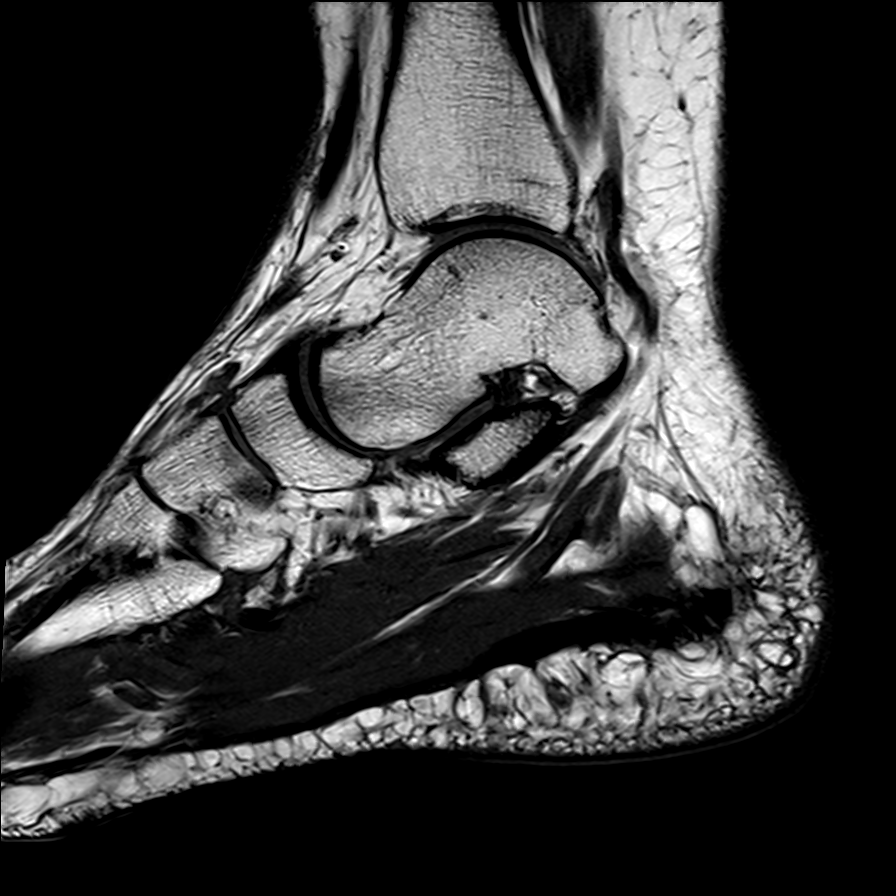

[Series 4: T2 fat-sat · axial · right · 3.5mm · 0.20mm/px · z∈[-73,+43]mm · 3 of 41 slices shown (1 of 2)]
[im 6/41]
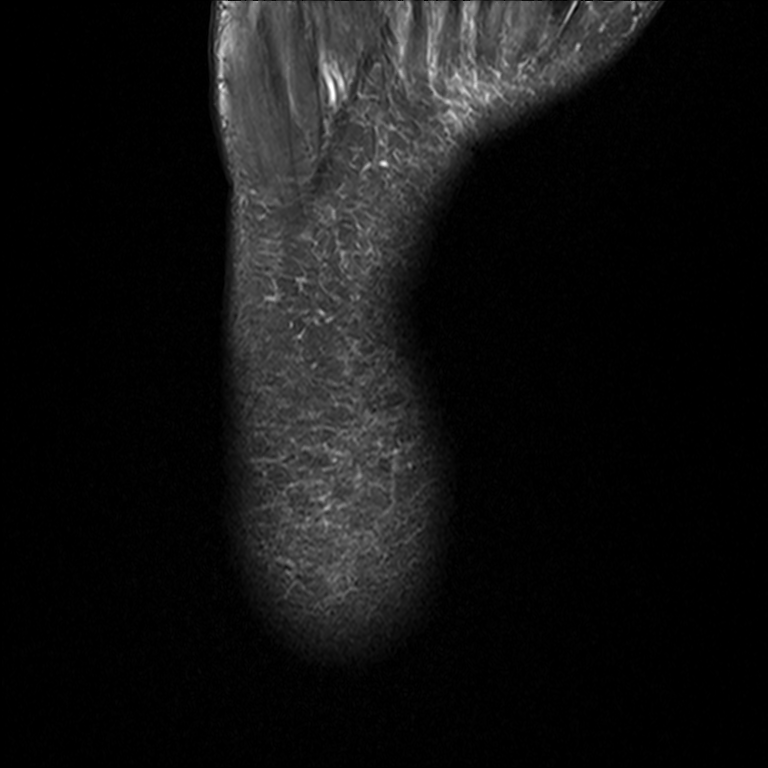
[im 21/41]
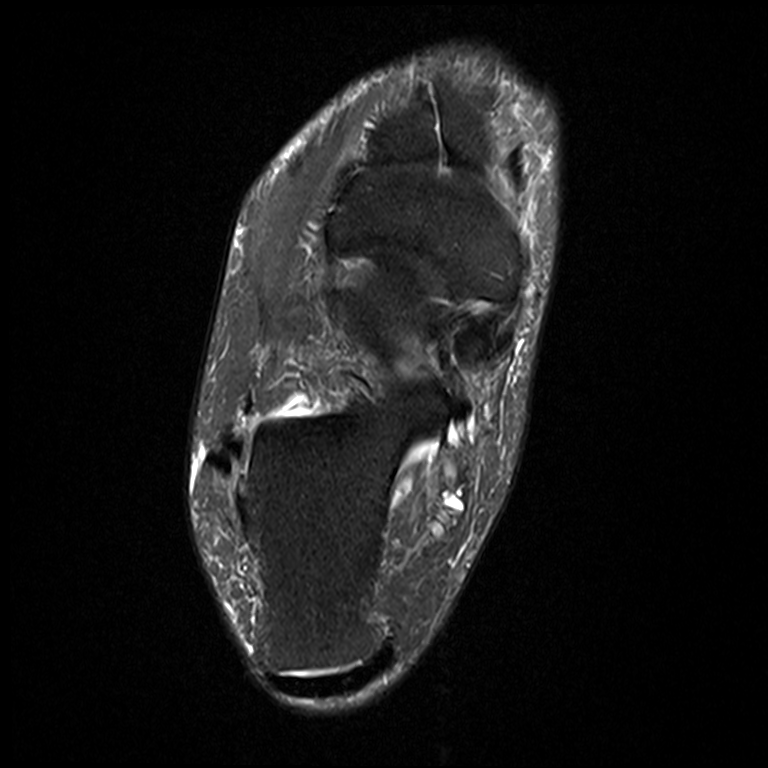
[im 36/41]
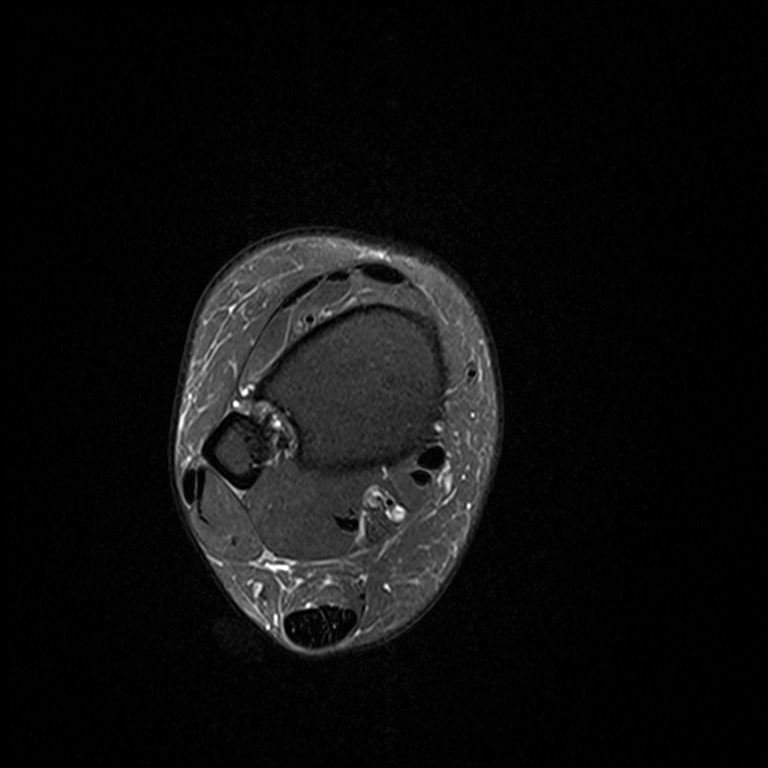

[Series 7: T2 fat-sat · coronal · right · 3.5mm · 0.21mm/px · 3 of 33 slices shown (2 of 2)]
[im 6/33]
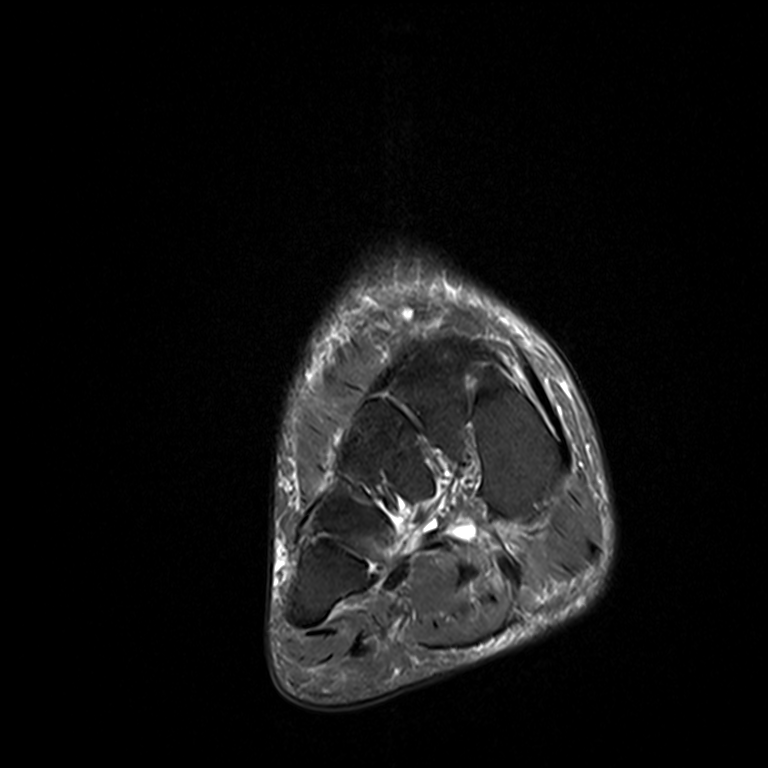
[im 17/33]
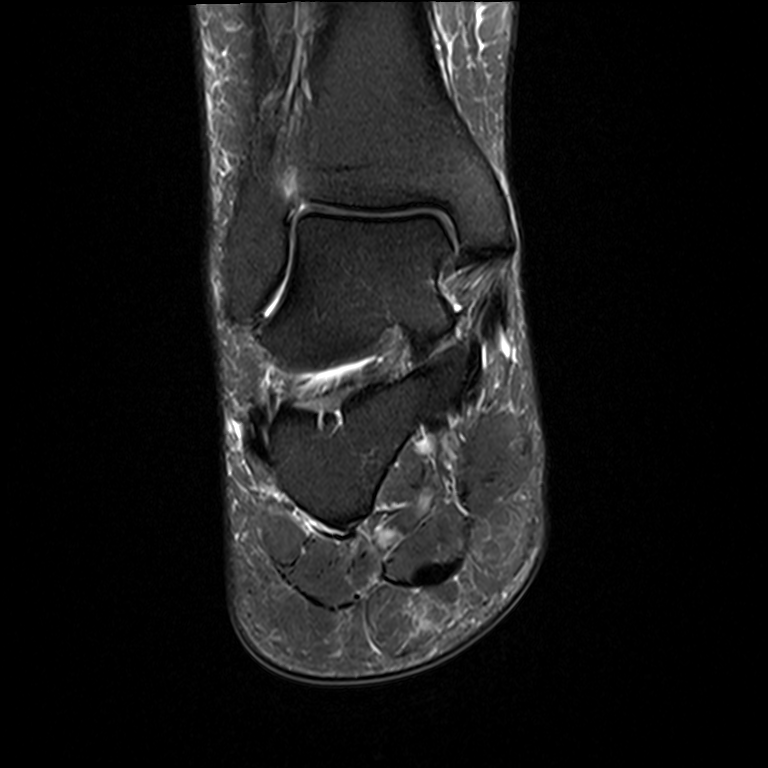
[im 27/33]
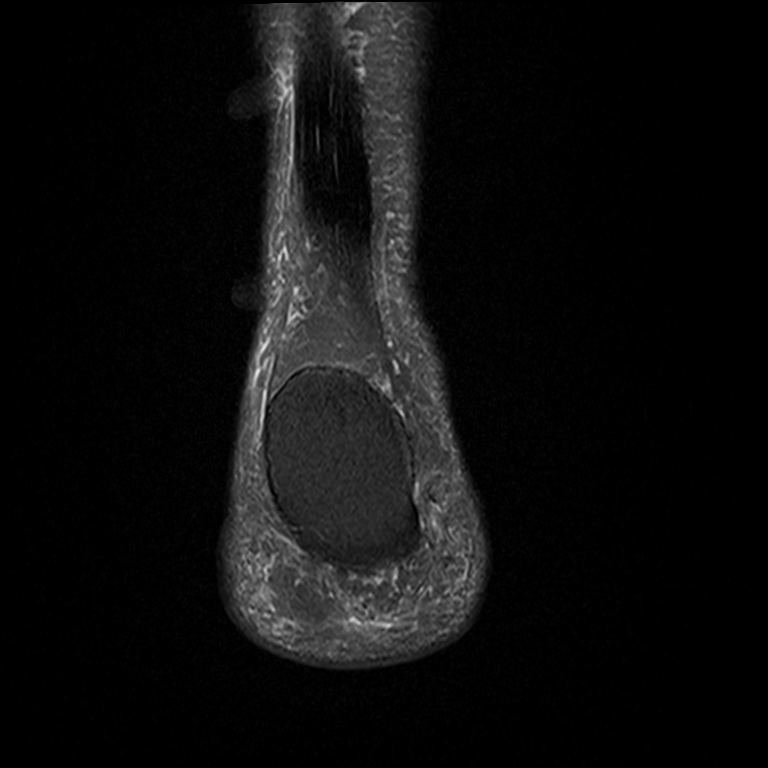

[8 of 40 positions shown; findings below may reference images not displayed]

FINDINGS: TENDONS

Peroneal: Normal.

Posteromedial: Minimal tenosynovitis.  The tendons are intact.

Anterior: Normal.

Achilles: Chronic Achilles tendinopathy with hypertrophy and
intrinsic degeneration centered approximately 4 cm above the
calcaneal insertion. No discrete tear. Distal Achilles tendon
appears essentially normal.

Plantar Fascia: Normal.

LIGAMENTS

Lateral: Normal.

Medial: Normal.

CARTILAGE

Ankle Joint: No joint effusion or chondral defect.

Subtalar Joints/Sinus Tarsi: Small effusion in the posterior aspect
of the subtalar joint. Otherwise normal.

Bones: No significant abnormality.

Other: None
IMPRESSION: Focal chronic Achilles tendinopathy with tendon degeneration and
hypertrophy as described.

Minimal tenosynovitis of the posteromedial tendons.

## 2018-07-06 ENCOUNTER — Ambulatory Visit (INDEPENDENT_AMBULATORY_CARE_PROVIDER_SITE_OTHER): Payer: 59 | Admitting: Psychiatry

## 2018-07-06 DIAGNOSIS — F4310 Post-traumatic stress disorder, unspecified: Secondary | ICD-10-CM | POA: Diagnosis not present
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
## 2018-08-03 ENCOUNTER — Ambulatory Visit (INDEPENDENT_AMBULATORY_CARE_PROVIDER_SITE_OTHER): Payer: 59 | Admitting: Psychiatry

## 2018-08-03 ENCOUNTER — Encounter: Payer: Self-pay | Admitting: Psychiatry

## 2018-08-03 DIAGNOSIS — F4310 Post-traumatic stress disorder, unspecified: Secondary | ICD-10-CM

## 2018-08-03 NOTE — Progress Notes (Signed)
[facility] Counselor/Therapist Progress Note   Patient ID: Rachel Ford, MRN: [id number]  Date: 08/03/2018  Timespent: 50 minutes   Treatment Type: Individual   Reported Symptoms: Feelings of Worthlessness, Hopelessness and Sleep disturbance   Mental Status Exam:    Appearance:   Casual     Behavior:  Appropriate  Motor:  Normal  Speech/Language:   Clear and Coherent  Affect:  Depressed  Mood:  anxious and sad  Thought process:  normal  Thought content:    WNL  Sensory/Perceptual disturbances:    WNL  Orientation:  oriented to person, place, time/date and situation  Attention:  Good  Concentration:  Good  Memory:  WNL  Fund of knowledge:   Good  Insight:    Good  Judgment:   Good  Impulse Control:  Good     Risk Assessment: Danger to Self:  No Self-injurious Behavior: No Danger to Others: No Duty to Warn:no Physical Aggression / Violence:No  Access to Firearms a concern: No  Gang Involvement:No    Subjective: The client comes in today very agitated sad and anxious.  She states, "I have have not been able to get out of bed."  She reports that she is not worked much this week.  Her parents came to visit unexpectedly which is always a trigger for her.  They came to watch her children's sports but did not alert her or her husband to the fact that they were coming.  She came home after work and they said they had to leave which was unexpected again.  Client said, "okay let me use the restroom."  When she came out of the bathroom they had left without saying goodbye.  This put the client into a tailspin. The client had made a new friend after letting go of the one she had tried to pursue for 4 years.  At last session she knew she was over texting her friend and wearing her out.  We had made a plan for her to dial it back and not text which she was doing very well with.  Her friend had even contacted her to do lunch which she declined because she wanted it to be  in a different setting than the school cafeteria.  Then after her parents left, it triggered all the abandonment and loss issues the client had and she began to "crazily text" her friend.  The friend finally told her to leave her alone and not text her.  The client is feeling abandoned and sad.  She has no suicidal or homicidal ideation and no intent. Today I used eye movement desensitization and assessing to reduce the clients overall level of disturbance from a 9+ to less than a 5 at the end of the session.  She was going to work to attend a meeting.  We discussed that she is not sleeping and that sleep is going to be a good foundation for her recovery.  She agreed to contact her primary care physician today to see if she could be seen and assessed for a sleep medication.  Client also believes she Rachel Ford have something at home that she has not used yet.  I tried to schedule appointment today with 1 of the providers here but everybody's schedule was full.  Client was confident she could figure something out.  I reiterated with the client how important sleep was that if she could get 3 or 4 nights of good sleep under her belt  her mood was significantly improved.  She agreed. We discussed the long-term plan of letting things calm down with her friend.  The anniversary of her friends dad's death comes up in 3 weeks.  We discussed sending a sympathy card with a simple note inside.  Client states that her friend has not unfriended her on Facebook.  This is a positive indication.  I asked the client to do no texting and let things alone until she sends the sympathy card.  The client agreed to try.   Interventions: Solution-Oriented/Positive Psychology, Eye Movement Desensitization and Reprocessing (EMDR) and Insight-Oriented   Diagnosis:   ICD-10-CM   1. PTSD (post-traumatic stress disorder) F43.10      Plan: No texting, sleep meds, self care.   Gelene Mink Azelie Noguera, [place]

## 2018-09-18 ENCOUNTER — Encounter

## 2018-09-21 ENCOUNTER — Encounter: Payer: Self-pay | Admitting: Psychiatry

## 2018-09-21 ENCOUNTER — Ambulatory Visit (INDEPENDENT_AMBULATORY_CARE_PROVIDER_SITE_OTHER): Payer: 59 | Admitting: Psychiatry

## 2018-09-21 DIAGNOSIS — F4310 Post-traumatic stress disorder, unspecified: Secondary | ICD-10-CM | POA: Diagnosis not present

## 2018-09-21 NOTE — Progress Notes (Signed)
      [facility] Counselor/Therapist Progress Note  Patient ID: KHALIDAH SEGRETI, MRN: [id number],    Date: 09/21/2018  Time Spent: 49 minutes   Treatment Type: Individual Therapy  Reported Symptoms: Anxious Mood, Ruminations, Irritability, Fatigue and Sleep disturbance  Mental Status Exam:  Appearance:   Casual and Well Groomed     Behavior:  Appropriate  Motor:  Normal  Speech/Language:   Clear and Coherent  Affect:  Tearful  Mood:  anxious, irritable and sad  Thought process:  normal  Thought content:    WNL  Sensory/Perceptual disturbances:    WNL  Orientation:  oriented to person, place, time/date and situation  Attention:  Good  Concentration:  Good  Memory:  WNL  Fund of knowledge:   Good  Insight:    Good  Judgment:   Good  Impulse Control:  Good   Risk Assessment: Danger to Self:  No Self-injurious Behavior: No Danger to Others: No Duty to Warn:no Physical Aggression / Violence:No  Access to Firearms a concern: No  Gang Involvement:No   Subjective: The client states that she is still not sleeping well.  She has not reconnected with her friend in a way that she finds significant.  Her friend being pregnant tells her she cannot handle the stress of their relationship.  The client has wanted to text her a lot but has held back.  I pointed out to the client what a huge change this is for her from her last relationship.  She agrees but is still obsessed and thinks about this friend all the time. The client agrees that so much of this is connected to her relationship with her parents.  We started with eye movement today and then switched to the EMDR hand paddles.  As the client processed she became less agitated.  She says that she has made progress.  She is still distressed over the ruminations and obsessions that she has.  She will journal but not send to her friend.  She agrees to hold off from contacting her.  If she does contact her it is light and  positive.  Interventions: Assertiveness/Communication, Solution-Oriented/Positive Psychology, Eye Movement Desensitization and Reprocessing (EMDR) and Insight-Oriented  Diagnosis:   ICD-10-CM   1. PTSD (post-traumatic stress disorder) F43.10     Plan: Boundaries, self-care, exercise.  Gelene Mink Boyde Grieco, [place]

## 2018-09-24 ENCOUNTER — Ambulatory Visit: Payer: 59 | Admitting: Psychiatry

## 2018-10-17 ENCOUNTER — Ambulatory Visit: Payer: 59 | Admitting: Psychiatry

## 2018-11-12 ENCOUNTER — Ambulatory Visit (INDEPENDENT_AMBULATORY_CARE_PROVIDER_SITE_OTHER): Payer: 59 | Admitting: Psychiatry

## 2018-11-12 ENCOUNTER — Encounter: Payer: Self-pay | Admitting: Psychiatry

## 2018-11-12 DIAGNOSIS — F4310 Post-traumatic stress disorder, unspecified: Secondary | ICD-10-CM

## 2018-11-12 NOTE — Progress Notes (Signed)
      [facility] Counselor/Therapist Progress Note  Patient ID: SHEPHANIE SPAUGH, MRN: [id number],    Date: 11/12/2018  Time Spent: 51 minutes   Treatment Type: Individual Therapy  Reported Symptoms: Sadness, anxiety, flashbacks.  Mental Status Exam:  Appearance:   Well Groomed     Behavior:  Appropriate  Motor:  Normal  Speech/Language:   Clear and Coherent  Affect:  Appropriate and Tearful  Mood:  anxious and sad  Thought process:  normal  Thought content:    Rumination  Sensory/Perceptual disturbances:    Flashback  Orientation:  oriented to person, place, time/date and situation  Attention:  Good  Concentration:  Good  Memory:  WNL  Fund of knowledge:   Good  Insight:    Good  Judgment:   Good  Impulse Control:  Good   Risk Assessment: Danger to Self:  No Self-injurious Behavior: No Danger to Others: No Duty to Warn:no Physical Aggression / Violence:No  Access to Firearms a concern: No  Gang Involvement:No   Subjective: The client reports that it has been difficult for the last few weeks.  Woman who was her new friend has completely cut her off.  She stated that they had some white text back-and-forth that seemed positive her friend is getting ready to have a baby in April the client gathered 7 other friends from school together to go in for some baby gifts.  When the client brought them to her she seemed appreciative. There was a Valentines dance at her children's school.  The client felt her interaction with her friend there was awkward.  She started texting her about that which then caused everything to get very tense.  She realized in retrospect that she was overreacting.  As the client discussed these things she became very tearful and anxious.  She flashes back to feeling the rejection from her parents.  She felt like nothing she could do would ever get them to pay attention to her.  She would like to fix this circumstance but it seems unlikely.  We discussed  different tactis in approaching her.  She finally decided to send a neutral text, "extending an olive branch" I used EMDR and paddles with the client which seems to work better for her.  She was able to reduce her level of disturbance from the subjective units of distress of 8+ to less than 3 at the end of the session.  Client will journal.  Interventions: Assertiveness/Communication, Solution-Oriented/Positive Psychology, Eye Movement Desensitization and Reprocessing (EMDR) and Insight-Oriented  Diagnosis:   ICD-10-CM   1. PTSD (post-traumatic stress disorder) F43.10     Plan: Aurea Graff, self care.  This record has been created using [employer].  Chart creation errors have been sought, but Meelah Tallo not always have been located and corrected. Such creation errors do not reflect on the standard of medical care.  Bowen Kia, [place]

## 2018-12-18 ENCOUNTER — Ambulatory Visit: Payer: 59 | Admitting: Psychiatry

## 2019-01-15 ENCOUNTER — Ambulatory Visit: Payer: 59 | Admitting: Psychiatry

## 2019-02-13 ENCOUNTER — Ambulatory Visit: Payer: 59 | Admitting: Psychiatry

## 2019-03-11 ENCOUNTER — Ambulatory Visit: Payer: 59 | Admitting: Psychiatry

## 2019-04-04 ENCOUNTER — Ambulatory Visit: Payer: 59 | Admitting: Psychiatry

## 2020-02-24 ENCOUNTER — Other Ambulatory Visit (HOSPITAL_COMMUNITY): Payer: Self-pay | Admitting: Internal Medicine

## 2020-02-24 ENCOUNTER — Other Ambulatory Visit: Payer: Self-pay

## 2020-02-24 ENCOUNTER — Ambulatory Visit (HOSPITAL_COMMUNITY)
Admission: RE | Admit: 2020-02-24 | Discharge: 2020-02-24 | Disposition: A | Discharge status: Home / Self Care | Payer: 59 | Source: Ambulatory Visit | Attending: Internal Medicine | Admitting: Internal Medicine

## 2020-02-24 DIAGNOSIS — M7989 Other specified soft tissue disorders: Secondary | ICD-10-CM | POA: Diagnosis not present

## 2020-02-24 DIAGNOSIS — M79605 Pain in left leg: Secondary | ICD-10-CM

## 2020-02-24 NOTE — Progress Notes (Signed)
Right lower extremity venous duplex has been completed. Preliminary results can be found in CV Proc through chart review.  Results were given to [facility] at Dr. Elmyra Ricks office.  02/24/20 1:11 PM Olen Cordial RVT

## 2020-03-29 ENCOUNTER — Ambulatory Visit (HOSPITAL_COMMUNITY): Admit: 2020-03-29 | Discharge status: Against Medical Advice | Payer: 59

## 2020-05-29 ENCOUNTER — Encounter: Payer: Self-pay | Admitting: Family Medicine

## 2020-05-29 ENCOUNTER — Other Ambulatory Visit: Payer: Self-pay

## 2020-05-29 ENCOUNTER — Ambulatory Visit (INDEPENDENT_AMBULATORY_CARE_PROVIDER_SITE_OTHER): Payer: 59 | Admitting: Family Medicine

## 2020-05-29 VITALS — BP 122/88 | HR 101 | Temp 98.2°F | Ht 67.0 in | Wt 262.4 lb

## 2020-05-29 DIAGNOSIS — Z114 Encounter for screening for human immunodeficiency virus [HIV]: Secondary | ICD-10-CM

## 2020-05-29 DIAGNOSIS — E282 Polycystic ovarian syndrome: Secondary | ICD-10-CM

## 2020-05-29 DIAGNOSIS — Z1159 Encounter for screening for other viral diseases: Secondary | ICD-10-CM

## 2020-05-29 DIAGNOSIS — R5383 Other fatigue: Secondary | ICD-10-CM

## 2020-05-29 DIAGNOSIS — E785 Hyperlipidemia, unspecified: Secondary | ICD-10-CM

## 2020-05-29 DIAGNOSIS — G4700 Insomnia, unspecified: Secondary | ICD-10-CM

## 2020-05-29 DIAGNOSIS — Z23 Encounter for immunization: Secondary | ICD-10-CM

## 2020-05-29 MED ORDER — ZOLPIDEM TARTRATE 5 MG PO TABS: 5.0000 mg | ORAL_TABLET | Freq: Every evening | ORAL | 1 refills | Status: DC | PRN

## 2020-05-29 MED ORDER — ROSUVASTATIN CALCIUM 10 MG PO TABS: 10.0000 mg | ORAL_TABLET | Freq: Every day | ORAL | 3 refills | Status: DC

## 2020-05-29 NOTE — Progress Notes (Signed)
Rachel Ford DOB: Aug 25, 1971 Encounter date: 05/29/2020  This is a 49 y.o. female who presents to establish care. Chief Complaint  Patient presents with  . Establish Care    History of present illness: Came from [facility] medical; hasn't been seeing doc regularly since her last doc left there 5 years ago.   Cholesterol is off the charts from last check. Didn't get it checked for a couple years with COVID. Grandfather died of heart attack at age 47. PA started her on statin but she didn't start taking this. LDL on 02/26/20 was 190. With COVID has gained about 20lbs. Started to work on eating healthy. (wrote crestor 10mg  for her).   Has had PCOS. Facial hair, hair loss on head.   Did have gestational diabetes. Sugars have been good.   Started having gallbladder attacks In 2017 and cut out a lot. Ended up getting gallbladder removed anyway and feels like she has just gained weight regardless. Had lost 60lbs at that time.   Cannot exercise - had herniated disc in 20's. Waited too long to have surgery. Has severe shooting nerve pain in both legs, feet. Can't stand for more than a few minutes. Has stenosis. Even in pool she has pain. Can walk slowly, but can't go fast and can't do it a long time. This is very limiting for her. Not seeing anyone for this. Had 3 evaluations a few years ago. One recommended surgery, another recommended against it, then 3rd said it would be 50/50. Injections worked for a little bit.   Hesitant to take medications. Had floaters with ocp in past.   Follows with nephrology at [facility] for kidney cysts.   Has terrible insomnia. Wakes at 3am every day. Gets tired again at 7 right when she is supposed to get up. Hasn't slept since having son; 44 years old. Had post partum depression and PTSD and just feels like she can't sleep. Worse close to cycle. Did have sleep eval in past, but didn't sleep well, and states it was not very revealing. Has tried melatonin, other  prescriptions.   Son has some sort of auto-inflammatory issues; intermittent fevers.    Past Medical History:  Diagnosis Date  . Depression    post partum  . Factor 5 Leiden mutation, heterozygous (HCC) 09/20/2012  . Hyperlipidemia   . Lumbar herniated disc   . Menorrhagia   . OCD (obsessive compulsive disorder)   . PCOS (polycystic ovarian syndrome)    Past Surgical History:  Procedure Laterality Date  . CESAREAN SECTION    . herniated disc surgery    . WISDOM TOOTH EXTRACTION     Allergies  Allergen Reactions  . Codeine    No outpatient medications have been marked as taking for the 05/29/20 encounter (Office Visit) with 07/29/20, MD.   Social History   Tobacco Use  . Smoking status: Never Smoker  . Smokeless tobacco: Never Used  . Tobacco comment: NA  Substance Use Topics  . Alcohol use: Yes    Comment: socially   Family History  Problem Relation Age of Onset  . Cancer Other        melanoma   . Hypertension Father   . Melanoma Mother   . Depression Sister   . Hyperlipidemia Sister   . Hyperlipidemia Brother   . Stroke Maternal Grandmother 58  . Early death Maternal Grandfather   . Heart attack Maternal Grandfather   . Diabetes Paternal Grandmother 76  . Parkinson's disease Paternal  Grandmother   . Alzheimer's disease Paternal Grandfather      Review of Systems  Constitutional: Negative for chills, fatigue and fever.  Respiratory: Negative for cough, chest tightness, shortness of breath and wheezing.   Cardiovascular: Negative for chest pain, palpitations and leg swelling.    Objective:  BP 122/88 (BP Location: Left Arm, Patient Position: Sitting, Cuff Size: Large)   Pulse (!) 101   Temp 98.2 F (36.8 C) (Oral)   Ht 5\' 7"  (1.702 m)   Wt 262 lb 6.4 oz (119 kg)   LMP 05/17/2020 (Exact Date)   BMI 41.10 kg/m   Weight: 262 lb 6.4 oz (119 kg)   BP Readings from Last 3 Encounters:  05/29/20 122/88  04/04/17 (!) 145/81  09/20/16 118/68    Wt Readings from Last 3 Encounters:  05/29/20 262 lb 6.4 oz (119 kg)  04/04/17 242 lb 1.6 oz (109.8 kg)  09/20/16 220 lb (99.8 kg)    Physical Exam Constitutional:      General: She is not in acute distress.    Appearance: She is well-developed.  Cardiovascular:     Rate and Rhythm: Normal rate and regular rhythm.     Heart sounds: Normal heart sounds. No murmur heard.  No friction rub.  Pulmonary:     Effort: Pulmonary effort is normal. No respiratory distress.     Breath sounds: Normal breath sounds. No wheezing or rales.  Musculoskeletal:     Right lower leg: No edema.     Left lower leg: No edema.  Neurological:     Mental Status: She is alert and oriented to person, place, and time.  Psychiatric:        Behavior: Behavior normal.     Assessment/Plan:  1. Hyperlipidemia, unspecified hyperlipidemia type She is going to start the crestor.  She did not like taking medications, but wants to make sure to control risk factors given family history of heart disease.  She is going to work on healthier eating, including lower carbohydrates and cutting out inflammatory foods.  She would like to decrease Crestor dose if possible. - Comprehensive metabolic panel; Future - Lipid panel; Future - TSH; Future  2. PCOS (polycystic ovarian syndrome) Consider med treatments in future. Low carb diet recommended.  - Hemoglobin A1c; Future  3. Fatigue, unspecified type Start with blood work.  Further evaluation pending this.  4. Insomnia, unspecified type She did request something to help with sleep.  They are going to start with low-dose Ambien, but I would consider extended release to help with latency of sleep.  5. Screening for HIV (human immunodeficiency virus) - HIV Antibody (routine testing w rflx); Future  6. Encounter for hepatitis C screening test for low risk patient - Hepatitis C antibody; Future  Return in about 3 months (around 08/28/2020) for lab appointment; then  office visit.  14/06/2020, MD

## 2020-07-23 ENCOUNTER — Telehealth: Payer: Self-pay

## 2020-07-23 NOTE — Telephone Encounter (Signed)
To Be Continued.. Explained to the patient that unfortunately we aren't able to do this, to which Rachel Ford's response was that she will find another doctor because she did not like Dr.Koberlin and does not like the fact she is not able to meet the doctor before establishing.

## 2020-07-23 NOTE — Telephone Encounter (Signed)
We got a notification that patient had rescheduled her new patient appointment with Dr.Andy that was scheduled originally on 07/27/20 at 1 pm and rescheduled for 09/02/20 at 4 pm.. When going in to the patients chart I realized that she was a Dr.Koberlin patient, called [facility] and explained that we were having to reschedule her appointment and explained that we would have to do a TOC to which the patient states that she wants to be able to schedule an appointment to meet the doctor without that being the established visit.

## 2020-07-27 ENCOUNTER — Ambulatory Visit: Payer: 59 | Admitting: Family Medicine

## 2020-08-21 ENCOUNTER — Other Ambulatory Visit: Payer: 59

## 2020-08-25 ENCOUNTER — Other Ambulatory Visit (INDEPENDENT_AMBULATORY_CARE_PROVIDER_SITE_OTHER): Payer: 59

## 2020-08-25 ENCOUNTER — Other Ambulatory Visit: Payer: Self-pay

## 2020-08-25 DIAGNOSIS — Z114 Encounter for screening for human immunodeficiency virus [HIV]: Secondary | ICD-10-CM

## 2020-08-25 DIAGNOSIS — E785 Hyperlipidemia, unspecified: Secondary | ICD-10-CM | POA: Diagnosis not present

## 2020-08-25 DIAGNOSIS — Z1159 Encounter for screening for other viral diseases: Secondary | ICD-10-CM

## 2020-08-25 DIAGNOSIS — E282 Polycystic ovarian syndrome: Secondary | ICD-10-CM

## 2020-08-25 LAB — COMPREHENSIVE METABOLIC PANEL
ALT: 20 U/L (ref 0–35)
AST: 16 U/L (ref 0–37)
Albumin: 3.8 g/dL (ref 3.5–5.2)
Alkaline Phosphatase: 75 U/L (ref 39–117)
BUN: 13 mg/dL (ref 6–23)
CO2: 29 mEq/L (ref 19–32)
Calcium: 9 mg/dL (ref 8.4–10.5)
Chloride: 104 mEq/L (ref 96–112)
Creatinine, Ser: 0.66 mg/dL (ref 0.40–1.20)
GFR: 103.14 mL/min (ref >60.00)
Glucose, Bld: 103 mg/dL — ABNORMAL HIGH (ref 70–99)
Potassium: 4.7 mEq/L (ref 3.5–5.1)
Sodium: 139 mEq/L (ref 135–145)
Total Bilirubin: 0.4 mg/dL (ref 0.2–1.2)
Total Protein: 6.5 g/dL (ref 6.0–8.3)

## 2020-08-25 LAB — LIPID PANEL
Cholesterol: 152 mg/dL (ref 0–200)
HDL: 48.8 mg/dL (ref >39.00)
LDL Cholesterol: 92 mg/dL (ref 0–99)
NonHDL: 103.36
Total CHOL/HDL Ratio: 3
Triglycerides: 57 mg/dL (ref 0.0–149.0)
VLDL: 11.4 mg/dL (ref 0.0–40.0)

## 2020-08-25 LAB — TSH: TSH: 2.85 u[IU]/mL (ref 0.35–4.50)

## 2020-08-25 LAB — HEMOGLOBIN A1C: Hgb A1c MFr Bld: 5.7 % (ref 4.6–6.5)

## 2020-08-25 NOTE — Addendum Note (Signed)
Addended by: Lerry Liner on: 08/25/2020 07:41 AM   Modules accepted: Orders

## 2020-08-26 LAB — HEPATITIS C ANTIBODY
Hepatitis C Ab: NONREACTIVE
SIGNAL TO CUT-OFF: 0.01 (ref <1.00)

## 2020-08-26 LAB — HIV ANTIBODY (ROUTINE TESTING W REFLEX): HIV 1&2 Ab, 4th Generation: NONREACTIVE

## 2020-08-28 ENCOUNTER — Ambulatory Visit (INDEPENDENT_AMBULATORY_CARE_PROVIDER_SITE_OTHER): Payer: 59 | Admitting: Family Medicine

## 2020-08-28 ENCOUNTER — Other Ambulatory Visit: Payer: Self-pay

## 2020-08-28 ENCOUNTER — Encounter: Payer: Self-pay | Admitting: Family Medicine

## 2020-08-28 VITALS — BP 112/90 | HR 82 | Temp 98.0°F | Ht 67.0 in | Wt 277.0 lb

## 2020-08-28 DIAGNOSIS — E282 Polycystic ovarian syndrome: Secondary | ICD-10-CM

## 2020-08-28 DIAGNOSIS — R7301 Impaired fasting glucose: Secondary | ICD-10-CM

## 2020-08-28 DIAGNOSIS — H6993 Unspecified Eustachian tube disorder, bilateral: Secondary | ICD-10-CM

## 2020-08-28 DIAGNOSIS — E785 Hyperlipidemia, unspecified: Secondary | ICD-10-CM | POA: Diagnosis not present

## 2020-08-28 DIAGNOSIS — H6983 Other specified disorders of Eustachian tube, bilateral: Secondary | ICD-10-CM

## 2020-08-28 DIAGNOSIS — G4700 Insomnia, unspecified: Secondary | ICD-10-CM | POA: Diagnosis not present

## 2020-08-28 DIAGNOSIS — R0683 Snoring: Secondary | ICD-10-CM

## 2020-08-28 MED ORDER — PREDNISONE 20 MG PO TABS: 40.0000 mg | ORAL_TABLET | Freq: Every day | ORAL | 0 refills | Status: DC

## 2020-08-28 MED ORDER — ZOLPIDEM TARTRATE ER 6.25 MG PO TBCR: 6.2500 mg | EXTENDED_RELEASE_TABLET | Freq: Every evening | ORAL | 0 refills | Status: DC | PRN

## 2020-08-28 MED ORDER — ROSUVASTATIN CALCIUM 5 MG PO TABS: 5.0000 mg | ORAL_TABLET | Freq: Every day | ORAL | 3 refills | Status: DC

## 2020-08-28 MED ORDER — TRAZODONE HCL 100 MG PO TABS: 100.0000 mg | ORAL_TABLET | Freq: Every day | ORAL | 1 refills | Status: DC

## 2020-08-28 NOTE — Progress Notes (Addendum)
Rachel Ford DOB: 07/28/1971 Encounter date: 08/28/2020  This is a 49 y.o. female who presents with Chief Complaint  Patient presents with  . Follow-up    History of present illness:  Hyperlipidemia: Taking Crestor 10 mg daily. Not taking every day because sometimes she forgets. Started with half tab. Usually gets about 4-5 days/out of week.   Insomnia: Ambien 5 mg nightly as needed. Not sleeping well. Waking frequently. Feels wired like she has caffeine drip. She lost [place] script after filling it, so has been without it. Does sleep a little better when she takes this. Feels like she has way too much energy during the day; like she is in fight or flight response. Not feeling wired through day; feels tired through day. Job has been crazy with increased stress. Sleep has been issue for about 10 years. Had issues during both pregnancies. Improved after first child was born; but restarted with second pregnancy and didn't go away. She does snore; she did have sleep study but didn't sleep enough to get feedback. This was probably 5 years ago per her. Always worse the week before her period. Usually wakes at 3am. Usually not able to go to bed until 10 due to life. Unable to fall asleep after the early morning waking. She did take trazodone when this first started and it didn't help. Even on vacation can't sleep.   Had head cold a couple of weeks ago; still very clogged up. No pain in sinuses, just very congested. Can't hear well. Not taking any medications at this point. No hx of seasonal allergies.   Impaired fasting glucose: Mild. She did have gestational diabetes. She had been nervous about blood sugar, but reassured by A1C.   PCOS: periods are regular; last couple of years have been really heavy, but now kind of back and forth with heaviness. Lasts a week and on heavy days she is wearing tampon and pad and changing q 2 hours. Lots of clotting.    Allergies  Allergen Reactions  .  Codeine    Current Meds  Medication Sig  . [DISCONTINUED] rosuvastatin (CRESTOR) 10 MG tablet Take 1 tablet (10 mg total) by mouth daily.  . [DISCONTINUED] zolpidem (AMBIEN) 5 MG tablet Take 1 tablet (5 mg total) by mouth at bedtime as needed for sleep.    Review of Systems  Constitutional: Negative for chills, fatigue and fever.  Respiratory: Negative for cough, chest tightness, shortness of breath and wheezing.   Cardiovascular: Negative for chest pain, palpitations and leg swelling.    Objective:  BP 112/90 (BP Location: Left Arm, Patient Position: Sitting, Cuff Size: Large)   Pulse 82   Temp 98 F (36.7 C) (Oral)   Ht 5\' 7"  (1.702 m)   Wt 277 lb (125.6 kg)   LMP 08/17/2020 (Exact Date)   BMI 43.38 kg/m   Weight: 277 lb (125.6 kg)   BP Readings from Last 3 Encounters:  08/28/20 112/90  05/29/20 122/88  04/04/17 (!) 145/81   Wt Readings from Last 3 Encounters:  08/28/20 277 lb (125.6 kg)  05/29/20 262 lb 6.4 oz (119 kg)  04/04/17 242 lb 1.6 oz (109.8 kg)    Physical Exam Constitutional:      General: She is not in acute distress.    Appearance: She is well-developed.  HENT:     Right Ear: Tympanic membrane and ear canal normal.     Left Ear: Tympanic membrane and ear canal normal.     Nose:  Right Turbinates: Enlarged and swollen.     Left Turbinates: Enlarged and swollen.  Cardiovascular:     Rate and Rhythm: Normal rate and regular rhythm.     Heart sounds: Normal heart sounds. No murmur heard. No friction rub.  Pulmonary:     Effort: Pulmonary effort is normal. No respiratory distress.     Breath sounds: Decreased breath sounds (slight at bases) present. No wheezing or rales.  Musculoskeletal:     Right lower leg: No edema.     Left lower leg: No edema.  Neurological:     Mental Status: She is alert and oriented to person, place, and time.  Psychiatric:        Behavior: Behavior normal.     Assessment/Plan  1. Hyperlipidemia, unspecified  hyperlipidemia type - rosuvastatin (CRESTOR) 5 MG tablet; Take 1 tablet (5 mg total) by mouth daily.  Dispense: 90 tablet; Refill: 3  2. Impaired fasting glucose Work on low carbohydrate diet.   3. Dysfunction of both eustachian tubes Trial prednisone. Discussed new medication(s) today with patient. Discussed potential side effects and patient verbalized understanding.  - predniSONE (DELTASONE) 20 MG tablet; Take 2 tablets (40 mg total) by mouth daily with breakfast.  Dispense: 10 tablet; Refill: 0  4. Insomnia, unspecified type Trial trazodone; will keep ambien as back up. - traZODone (DESYREL) 100 MG tablet; Take 1 tablet (100 mg total) by mouth at bedtime.  Dispense: 90 tablet; Refill: 1 - zolpidem (AMBIEN CR) 6.25 MG CR tablet; Take 1 tablet (6.25 mg total) by mouth at bedtime as needed for sleep.  Dispense: 30 tablet; Refill: 0 - Ambulatory referral to Sleep Studies  5. Snoring - Ambulatory referral to Sleep Studies   Referring for med weight loss as well per patient request.  She really wishes to avoid medications when possible. Has been on metformin in remote past. Some interest perhaps in ozempic if helpful for weight loss. Return for Chronic condition visit. Spent 45 min with patient; in visit, discussion of sleep, referrals, charting, chart review.   Theodis Shove, MD

## 2020-08-28 NOTE — Patient Instructions (Addendum)
If continuing to be congested you can try flonase nasal spray and antihistamine of your choice.   i'm including some carbohydrate information for you; just working on low carb eating should help with feeling better as well.   Carbohydrate Counting for Diabetes Mellitus, Adult  Carbohydrate counting is a method of keeping track of how many carbohydrates you eat. Eating carbohydrates naturally increases the amount of sugar (glucose) in the blood. Counting how many carbohydrates you eat helps keep your blood glucose within normal limits, which helps you manage your diabetes (diabetes mellitus). It is important to know how many carbohydrates you can safely have in each meal. This is different for every person. A diet and nutrition specialist (registered dietitian) can help you make a meal plan and calculate how many carbohydrates you should have at each meal and snack. Carbohydrates are found in the following foods:  Grains, such as breads and cereals.  Dried beans and soy products.  Starchy vegetables, such as potatoes, peas, and corn.  Fruit and fruit juices.  Milk and yogurt.  Sweets and snack foods, such as cake, cookies, candy, chips, and soft drinks. How do I count carbohydrates? There are two ways to count carbohydrates in food. You can use either of the methods or a combination of both. Reading "Nutrition Facts" on packaged food The "Nutrition Facts" list is included on the labels of almost all packaged foods and beverages in the [place] It includes:  The serving size.  Information about nutrients in each serving, including the grams (g) of carbohydrate per serving. To use the "Nutrition Facts":  Decide how many servings you will have.  Multiply the number of servings by the number of carbohydrates per serving.  The resulting number is the total amount of carbohydrates that you will be having. Learning standard serving sizes of other foods When you eat carbohydrate foods that  are not packaged or do not include "Nutrition Facts" on the label, you need to measure the servings in order to count the amount of carbohydrates:  Measure the foods that you will eat with a food scale or measuring cup, if needed.  Decide how many standard-size servings you will eat.  Multiply the number of servings by 15. Most carbohydrate-rich foods have about 15 g of carbohydrates per serving. ? For example, if you eat 8 oz (170 g) of strawberries, you will have eaten 2 servings and 30 g of carbohydrates (2 servings x 15 g = 30 g).  For foods that have more than one food mixed, such as soups and casseroles, you must count the carbohydrates in each food that is included. The following list contains standard serving sizes of common carbohydrate-rich foods. Each of these servings has about 15 g of carbohydrates:   hamburger bun or  English muffin.   oz (15 mL) syrup.   oz (14 g) jelly.  1 slice of bread.  1 six-inch tortilla.  3 oz (85 g) cooked rice or pasta.  4 oz (113 g) cooked dried beans.  4 oz (113 g) starchy vegetable, such as peas, corn, or potatoes.  4 oz (113 g) hot cereal.  4 oz (113 g) mashed potatoes or  of a large baked potato.  4 oz (113 g) canned or frozen fruit.  4 oz (120 mL) fruit juice.  4-6 crackers.  6 chicken nuggets.  6 oz (170 g) unsweetened dry cereal.  6 oz (170 g) plain fat-free yogurt or yogurt sweetened with artificial sweeteners.  8 oz (240  mL) milk.  8 oz (170 g) fresh fruit or one small piece of fruit.  24 oz (680 g) popped popcorn. Example of carbohydrate counting Sample meal  3 oz (85 g) chicken breast.  6 oz (170 g) brown rice.  4 oz (113 g) corn.  8 oz (240 mL) milk.  8 oz (170 g) strawberries with sugar-free whipped topping. Carbohydrate calculation 1. Identify the foods that contain carbohydrates: ? Rice. ? Corn. ? Milk. ? Strawberries. 2. Calculate how many servings you have of each food: ? 2 servings  rice. ? 1 serving corn. ? 1 serving milk. ? 1 serving strawberries. 3. Multiply each number of servings by 15 g: ? 2 servings rice x 15 g = 30 g. ? 1 serving corn x 15 g = 15 g. ? 1 serving milk x 15 g = 15 g. ? 1 serving strawberries x 15 g = 15 g. 4. Add together all of the amounts to find the total grams of carbohydrates eaten: ? 30 g + 15 g + 15 g + 15 g = 75 g of carbohydrates total. Summary  Carbohydrate counting is a method of keeping track of how many carbohydrates you eat.  Eating carbohydrates naturally increases the amount of sugar (glucose) in the blood.  Counting how many carbohydrates you eat helps keep your blood glucose within normal limits, which helps you manage your diabetes.  A diet and nutrition specialist (registered dietitian) can help you make a meal plan and calculate how many carbohydrates you should have at each meal and snack. This information is not intended to replace advice given to you by your health care provider. Make sure you discuss any questions you have with your health care provider. Document Revised: 03/30/2017 Document Reviewed: 02/17/2016 Elsevier Patient Education  [place].

## 2020-09-02 ENCOUNTER — Ambulatory Visit: Payer: 59 | Admitting: Family Medicine

## 2020-12-15 ENCOUNTER — Institutional Professional Consult (permissible substitution): Payer: 59 | Admitting: Pulmonary Disease

## 2020-12-24 ENCOUNTER — Telehealth (INDEPENDENT_AMBULATORY_CARE_PROVIDER_SITE_OTHER): Payer: 59 | Admitting: Family Medicine

## 2020-12-24 DIAGNOSIS — R059 Cough, unspecified: Secondary | ICD-10-CM

## 2020-12-24 DIAGNOSIS — R519 Headache, unspecified: Secondary | ICD-10-CM | POA: Diagnosis not present

## 2020-12-24 DIAGNOSIS — R0981 Nasal congestion: Secondary | ICD-10-CM

## 2020-12-24 MED ORDER — BENZONATATE 100 MG PO CAPS: 100.0000 mg | ORAL_CAPSULE | Freq: Three times a day (TID) | ORAL | 0 refills | Status: DC | PRN

## 2020-12-24 MED ORDER — PREDNISONE 20 MG PO TABS
40.0000 mg | ORAL_TABLET | Freq: Every day | ORAL | 0 refills | Status: DC
Start: 2020-12-24 — End: 2021-01-15

## 2020-12-24 NOTE — Patient Instructions (Addendum)
-  I sent the medication(s) we discussed to your pharmacy: Meds ordered this encounter  Medications  . benzonatate (TESSALON PERLES) 100 MG capsule    Sig: Take 1 capsule (100 mg total) by mouth 3 (three) times daily as needed.    Dispense:  20 capsule    Refill:  0  . predniSONE (DELTASONE) 20 MG tablet    Sig: Take 2 tablets (40 mg total) by mouth daily with breakfast.    Dispense:  8 tablet    Refill:  0   Nasal saline flush twice daily  Avoid dairy while sick  If you want to try Vit c, 500mg  daily is plenty.  Drink plenty of water.  Could try an allergy pill such as zyrtec or Allegra, though this seems like it may more likely be a viral infection.  Flonase 2 sprays each nostril daily if you can tolerate.  I hope you are feeling better soon!  Seek in person care promptly if your symptoms worsen, new concerns arise or you are not improving with treatment.  It was nice to meet you today. I help [facility] out with telemedicine visits on Tuesdays and Thursdays and am available for visits on those days. If you have any concerns or questions following this visit please schedule a follow up visit with your Primary Care doctor or seek care at a local urgent care clinic to avoid delays in care.

## 2020-12-24 NOTE — Progress Notes (Signed)
Virtual Visit via Video Note  I connected with Rachel Ford  on 12/24/20 at 10:40 AM EDT by a video enabled telemedicine application and verified that I am speaking with the correct person using two identifiers.  Location patient: home, [place] Location provider:work or home office Persons participating in the virtual visit: patient, provider  I discussed the limitations of evaluation and management by telemedicine and the availability of in person appointments. The patient expressed understanding and agreed to proceed.   HPI:  Acute telemedicine visit for congestion and cough: -Onset: 3 days ago -Symptoms include: nasal congestion, cough, feels more tired than usual, feels like glands in neck are swollen, sinus discomfort, sore throat, loose bowels -at home covid test negative -she thought it was allergies initially -Denies: fevers, body aches, NV, inability to eat/drink/get out of bed, CP, SOB, known sick contacts -reports in the past when she gets this way with her sinuses her PCP has provided prednisone which she felt work really well. She is requesting this as will be traveling next week.  -sister and kids a little sick with a cold or pollen -Has tried: tylenol sinus medicine -Pertinent medication allergies: codeine -COVID-19 vaccine status:  Had flu shot, had covid vaccines + booster  ROS: See pertinent positives and negatives per HPI.  Past Medical History:  Diagnosis Date  . Depression    post partum  . Factor 5 Leiden mutation, heterozygous (HCC) 09/20/2012  . Hyperlipidemia   . Lumbar herniated disc   . Menorrhagia   . OCD (obsessive compulsive disorder)   . PCOS (polycystic ovarian syndrome)     Past Surgical History:  Procedure Laterality Date  . CESAREAN SECTION    . herniated disc surgery    . WISDOM TOOTH EXTRACTION       Current Outpatient Medications:  .  rosuvastatin (CRESTOR) 5 MG tablet, Take 1 tablet (5 mg total) by mouth daily., Disp: 90 tablet, Rfl: 3 .   traZODone (DESYREL) 100 MG tablet, Take 1 tablet (100 mg total) by mouth at bedtime., Disp: 90 tablet, Rfl: 1 .  zolpidem (AMBIEN CR) 6.25 MG CR tablet, Take 1 tablet (6.25 mg total) by mouth at bedtime as needed for sleep., Disp: 30 tablet, Rfl: 0  EXAM:  VITALS per patient if applicable:  GENERAL: alert, oriented, appears well and in no acute distress  HEENT: atraumatic, conjunttiva clear, no obvious abnormalities on inspection of external nose and ears  NECK: normal movements of the head and neck  LUNGS: on inspection no signs of respiratory distress, breathing rate appears normal, no obvious gross SOB, gasping or wheezing  CV: no obvious cyanosis  MS: moves all visible extremities without noticeable abnormality  PSYCH/NEURO: pleasant and cooperative, no obvious depression or anxiety, speech and thought processing grossly intact  ASSESSMENT AND PLAN:  Discussed the following assessment and plan:  Nasal congestion  Facial discomfort  Cough  -we discussed possible serious and likely etiologies, options for evaluation and workup, limitations of telemedicine visit vs in person visit, treatment, treatment risks and precautions. Pt prefers to treat via telemedicine empirically rather than in person at this moment. Query viral URI, AR vs other. She is adamant about taking prednisone as feels this has worked well in the past and is reluctant to use nasal INS due to the taste. Discussed risks at length and advised nasal saline, repeat covid test, short course nasal decongestant before flying,  symptomatic care measures and allergy regimen with topical INS despite mild dislike much safer than an  oral steroid. However, she would like to get a prednisone rx since will be traveling and understands the risks. She agrees to try other measures first. Tessalon sent for cough.  Scheduled follow up with PCP offered: agrees to schedule follow up if needed and discussed options for care when out of  state if needed (local [facility] or national telemedicine options. ) Advised to seek prompt in person care if worsening, new symptoms arise, or if is not improving with treatment. Discussed options for inperson care if PCP office not available. Did let this patient know that I only do telemedicine on Tuesdays and Thursdays for [facility]. Advised to schedule follow up visit with PCP or [facility] if any further questions or concerns to avoid delays in care.   I discussed the assessment and treatment plan with the patient. The patient was provided an opportunity to ask questions and all were answered. The patient agreed with the plan and demonstrated an understanding of the instructions.     Terressa Koyanagi, DO

## 2021-01-14 NOTE — Progress Notes (Signed)
01/15/21- 69 yoF never smoker for sleep evaluation courtesy of Dr Hassan Rowan with concerns of insomnia and snoring. Medical problem list includes PCOS, Lumbar Disc Disease, Factor 5 Leiden heterozygote, OCD, Hyperlipidemia, Depression -Med list includes trazodone 100, Ambien CR 6.25 Epworth score-4 Body weight today-261 lbs Covid vax-3 Phizer Flu vax-had TSH wnl. -----Patient states that she snores and has noticed that she wakes up gasping for air. States that over the last 10 years she has not slept well. She has no trouble going to sleep but has trouble staying asleep and wakes up around 2-3 am wide awake and can sometimes go back to sleep around 7 am.  Onset after second pregnancy of pattern of WASO around 2:00 AM, wide awake . Significantly worse in week before menses. Rarely uses.s trazodone or ambien. Doesn't like meds and these tend to hang-over.  Drowsy in day. Energy drink in AM, +/- soda.  Sleep is restless, frequent position changes and disrupts covers.  Husband in separate room due to his snoring.  ENT surgery- none. Mild nasal congestion.- minimizes, and dislikes Flonase.   Prior to Admission medications   Medication Sig Start Date End Date Taking? Authorizing Provider  rosuvastatin (CRESTOR) 5 MG tablet Take 1 tablet (5 mg total) by mouth daily. 08/28/20  Yes Koberlein, Paris Lore, MD  traZODone (DESYREL) 100 MG tablet Take 1 tablet (100 mg total) by mouth at bedtime. 08/28/20  Yes Wynn Banker, MD  zaleplon (SONATA) 10 MG capsule 1 for sleep as needed 01/15/21  Yes Wells Gerdeman, Joni Fears D, MD  zolpidem (AMBIEN CR) 6.25 MG CR tablet Take 1 tablet (6.25 mg total) by mouth at bedtime as needed for sleep. 08/28/20  Yes Wynn Banker, MD   Past Medical History:  Diagnosis Date  . Depression    post partum  . Factor 5 Leiden mutation, heterozygous (HCC) 09/20/2012  . Hyperlipidemia   . Lumbar herniated disc   . Menorrhagia   . OCD (obsessive compulsive disorder)   . PCOS  (polycystic ovarian syndrome)    Past Surgical History:  Procedure Laterality Date  . CESAREAN SECTION    . herniated disc surgery    . WISDOM TOOTH EXTRACTION     Family History  Problem Relation Age of Onset  . Cancer Other        melanoma   . Hypertension Father   . Melanoma Mother   . Depression Sister   . Hyperlipidemia Sister   . Hyperlipidemia Brother   . Stroke Maternal Grandmother 73  . Early death Maternal Grandfather   . Heart attack Maternal Grandfather   . Diabetes Paternal Grandmother 89  . Parkinson's disease Paternal Grandmother   . Alzheimer's disease Paternal Grandfather    Social History   Socioeconomic History  . Marital status: Married    Spouse name: Not on file  . Number of children: Not on file  . Years of education: Not on file  . Highest education level: Not on file  Occupational History  . Not on file  Tobacco Use  . Smoking status: Never Smoker  . Smokeless tobacco: Never Used  . Tobacco comment: NA  Vaping Use  . Vaping Use: Never used  Substance and Sexual Activity  . Alcohol use: Yes    Comment: socially  . Drug use: Never  . Sexual activity: Not Currently  Other Topics Concern  . Not on file  Social History Narrative   Married; 2 children; ages 60 and 3 yrs   Began menstrual  cycle at 13 yrs   3 pregnancies; 1 miscarriage            Social Determinants of Health   Financial Resource Strain: Not on file  Food Insecurity: Not on file  Transportation Needs: Not on file  Physical Activity: Not on file  Stress: Not on file  Social Connections: Not on file  Intimate Partner Violence: Not on file   ROS-see HPI   + = positive Constitutional:    weight loss, night sweats, fevers, chills, fatigue, lassitude. HEENT:    headaches, difficulty swallowing, tooth/dental problems, sore throat,       sneezing, itching, ear ache, +nasal congestion, post nasal drip, snoring CV:    chest pain, orthopnea, PND, swelling in lower extremities,  anasarca,                                  dizziness, palpitations Resp:   shortness of breath with exertion or at rest.                productive cough,   non-productive cough, coughing up of blood.              change in color of mucus.  wheezing.   Skin:    rash or lesions. GI:  No-   heartburn, indigestion, abdominal pain, nausea, vomiting, diarrhea,                 change in bowel habits, loss of appetite GU: dysuria, change in color of urine, no urgency or frequency.   flank pain. MS:   joint pain, stiffness, decreased range of motion, back pain. Neuro-     nothing unusual Psych:  change in mood or affect.  depression or +anxiety.   memory loss.  OBJ- Physical Exam General- Alert, Oriented, Affect-appropriate, Distress- none acute, + obese Skin- rash-none, lesions- none, excoriation- none Lymphadenopathy- none Head- atraumatic            Eyes- Gross vision intact, PERRLA, conjunctivae and secretions clear            Ears- Hearing, canals-normal            Nose- Clear, no-Septal dev, mucus, polyps, erosion, perforation             Throat- Mallampati III-IV , mucosa clear , drainage- none, tonsils- atrophic, + teeth Neck- flexible , trachea midline, no stridor , thyroid nl, carotid no bruit Chest - symmetrical excursion , unlabored           Heart/CV- RRR , no murmur , no gallop  , no rub, nl s1 s2                           - JVD- none , edema- none, stasis changes- none, varices- none           Lung- clear to P&A, wheeze- none, cough- none , dullness-none, rub- none           Chest wall-  Abd-  Br/ Gen/ Rectal- Not done, not indicated Extrem- cyanosis- none, clubbing, none, atrophy- none, strength- nl Neuro- grossly intact to observation

## 2021-01-15 ENCOUNTER — Ambulatory Visit (INDEPENDENT_AMBULATORY_CARE_PROVIDER_SITE_OTHER): Payer: 59 | Admitting: Internal Medicine

## 2021-01-15 ENCOUNTER — Other Ambulatory Visit: Payer: Self-pay

## 2021-01-15 ENCOUNTER — Encounter: Payer: Self-pay | Admitting: Internal Medicine

## 2021-01-15 DIAGNOSIS — R0683 Snoring: Secondary | ICD-10-CM | POA: Diagnosis not present

## 2021-01-15 DIAGNOSIS — F5101 Primary insomnia: Secondary | ICD-10-CM | POA: Diagnosis not present

## 2021-01-15 DIAGNOSIS — G4700 Insomnia, unspecified: Secondary | ICD-10-CM | POA: Insufficient documentation

## 2021-01-15 MED ORDER — ZALEPLON 10 MG PO CAPS: ORAL_CAPSULE | ORAL | 3 refills | Status: DC

## 2021-01-15 NOTE — Patient Instructions (Signed)
Order- schedule home sleep test dx snoring  Please call us for results about 2 weeks after your sleep test Script sent to try Sonata as a sleep aid to take when you wake at night, instead of trazodone or ambien, for now. Ok to take sonata the night of your sleep test.

## 2021-01-15 NOTE — Assessment & Plan Note (Signed)
Snoring, daytime tiredness and exam suggest OSA. Appropriate discussion, including responsibility to drive safely Plan- sleep study then possibly CPAP

## 2021-01-15 NOTE — Assessment & Plan Note (Signed)
Habitual waking around 2:00AM. Pattern developed with hormone changes of childbirth, but persistent. We discussed basic sleep hygiene. This may be aggravated by her restlessness in bed she attributes to nerve and back pain.  Plan- initial trial of short half-life sonata taken if she wakes during night.

## 2021-03-03 ENCOUNTER — Encounter (HOSPITAL_COMMUNITY): Payer: Self-pay | Admitting: Emergency Medicine

## 2021-03-03 ENCOUNTER — Other Ambulatory Visit: Payer: Self-pay

## 2021-03-03 ENCOUNTER — Emergency Department (HOSPITAL_COMMUNITY): Payer: 59

## 2021-03-03 ENCOUNTER — Emergency Department (HOSPITAL_COMMUNITY)
Admission: EM | Admit: 2021-03-03 | Discharge: 2021-03-04 | Disposition: A | Discharge status: Home / Self Care | Payer: 59 | Attending: Emergency Medicine | Admitting: Emergency Medicine

## 2021-03-03 DIAGNOSIS — G4700 Insomnia, unspecified: Secondary | ICD-10-CM | POA: Insufficient documentation

## 2021-03-03 DIAGNOSIS — R0789 Other chest pain: Secondary | ICD-10-CM | POA: Diagnosis present

## 2021-03-03 LAB — BASIC METABOLIC PANEL
Anion gap: 8 (ref 5–15)
BUN: 13 mg/dL (ref 6–20)
CO2: 24 mmol/L (ref 22–32)
Calcium: 9.1 mg/dL (ref 8.9–10.3)
Chloride: 107 mmol/L (ref 98–111)
Creatinine, Ser: 0.74 mg/dL (ref 0.44–1.00)
GFR, Estimated: >60 mL/min (ref >60)
Glucose, Bld: 107 mg/dL — ABNORMAL HIGH (ref 70–99)
Potassium: 3.8 mmol/L (ref 3.5–5.1)
Sodium: 139 mmol/L (ref 135–145)

## 2021-03-03 LAB — I-STAT BETA HCG BLOOD, ED (MC, WL, AP ONLY): I-stat hCG, quantitative: <5 m[IU]/mL (ref <5)

## 2021-03-03 LAB — CBC
HCT: 38.9 % (ref 36.0–46.0)
Hemoglobin: 12.5 g/dL (ref 12.0–15.0)
MCH: 29.8 pg (ref 26.0–34.0)
MCHC: 32.1 g/dL (ref 30.0–36.0)
MCV: 92.6 fL (ref 80.0–100.0)
Platelets: 286 10*3/uL (ref 150–400)
RBC: 4.2 MIL/uL (ref 3.87–5.11)
RDW: 13 % (ref 11.5–15.5)
WBC: 10.6 10*3/uL — ABNORMAL HIGH (ref 4.0–10.5)
nRBC: 0 % (ref 0.0–0.2)

## 2021-03-03 LAB — TROPONIN I (HIGH SENSITIVITY): Troponin I (High Sensitivity): 4 ng/L (ref <18)

## 2021-03-03 MED ORDER — ASPIRIN 81 MG PO CHEW
162.0000 mg | CHEWABLE_TABLET | Freq: Once | ORAL | Status: AC
  Administered 2021-03-03: 162 mg via ORAL
  Filled 2021-03-03: qty 2

## 2021-03-03 MED ORDER — LORAZEPAM 1 MG PO TABS
1.0000 mg | ORAL_TABLET | Freq: Once | ORAL | Status: AC
  Administered 2021-03-03: 1 mg via ORAL
  Filled 2021-03-03: qty 1

## 2021-03-03 MED ORDER — ACETAMINOPHEN 500 MG PO TABS
1000.0000 mg | ORAL_TABLET | Freq: Once | ORAL | Status: AC
  Administered 2021-03-03: 1000 mg via ORAL
  Filled 2021-03-03: qty 2

## 2021-03-03 NOTE — ED Provider Notes (Signed)
Emergency Medicine Provider Triage Evaluation Note  Rachel Ford , a 50 y.o. female  was evaluated in triage.  Pt complains of intermittent chest pressure x2 weeks that radiates throughout entire anterior chest wall. She endorses bilateral arm paraesthesias and "heaviness" today. She notes it feels like a rope is wrapped around her chest. No history of blood clots. No hormonal treatments.   Review of Systems  Positive: CP Negative: fever  Physical Exam  BP (!) 163/98 (BP Location: Right Arm)   Pulse 82   Temp 97.9 F (36.6 C) (Oral)   Resp 19   SpO2 100%  Gen:   Awake, no distress   Resp:  Normal effort  MSK:   Moves extremities without difficulty  Other:    Medical Decision Making  Medically screening exam initiated at 8:35 PM.  Appropriate orders placed.  Rachel Ford was informed that the remainder of the evaluation will be completed by another provider, this initial triage assessment does not replace that evaluation, and the importance of remaining in the ED until their evaluation is complete.  Cardiac labs ordered.    Rachel Ford 03/03/21 2037    Rachel Dibbles, MD 03/04/21 [phone]

## 2021-03-03 NOTE — ED Triage Notes (Signed)
Pt reports intermittent chest pain and pressure x 2 weeks. Reports today she has tingling and heaviness in bilateral arms, and pain to her neck and jaw.

## 2021-03-03 NOTE — ED Provider Notes (Signed)
[facility] EMERGENCY DEPARTMENT Provider Note   CSN: [id number] Arrival date & time: 03/03/21  2013     History Chief Complaint  Patient presents with   Chest Pain    Lam Bjorklund is a 50 y.o. female.  HPI Patient is a 50 year old female with past medical history significant for PCOS, OCD, HLD, depression, anxiety  Patient is presented today with chest pain for the past 2 weeks.  She states it is associated with increased stress at work and states that she has been more agitated, irritable and is late for her period and is concerned that she is going through menopause.  She states that the pain is a pressure-like sensation achy and constant although she states it does not seem to be present in the morning and seem to come on over the course of the day.  Does not seem to be reliably exertional or pleuritic.  She is not having any lightheadedness or dizziness.  She states that her arms feel somewhat heavy which occurred today and states that her chest pain seems like it got somewhat worse and seem to radiate into her jaw which made her concerned and prompted her to come to the ER for evaluation.  She has been evaluated for the symptoms by her PCP in the past with an EKG and lab work of some kind.  She states that she was told this was most likely anxiety or musculoskeletal.  No recent surgeries, hospitalization, long travel, hemoptysis, estrogen containing OCP, cancer history.  No unilateral leg swelling.  No history of PE or VTE.   She has no cardiac history that she knows of.  Denies any diaphoresis, shortness of breath, back pain.  Denies any numbness or weakness in her legs arms or other part of her body.  She states she does have chronic leg pain in her left leg which is not new and has not changed.  HPI: A 50 year old patient with a history of hypercholesterolemia and obesity presents for evaluation of chest pain. Initial onset of pain was more than 6  hours ago. The patient's chest pain is not worse with exertion. The patient's chest pain is not middle- or left-sided, is not well-localized, is not described as heaviness/pressure/tightness, is not sharp and does radiate to the arms/jaw/neck. The patient does not complain of nausea and denies diaphoresis. The patient has no history of stroke, has no history of peripheral artery disease, has not smoked in the past 90 days, denies any history of treated diabetes, has no relevant family history of coronary artery disease (first degree relative at less than age 14) and is not hypertensive.   Past Medical History:  Diagnosis Date   Depression    post partum   Factor 5 Leiden mutation, heterozygous (HCC) 09/20/2012   Hyperlipidemia    Lumbar herniated disc    Menorrhagia    OCD (obsessive compulsive disorder)    PCOS (polycystic ovarian syndrome)     Patient Active Problem List   Diagnosis Date Noted   Snoring 01/15/2021   Insomnia 01/15/2021   Hyperlipidemia 08/28/2020   Factor 5 Leiden mutation, heterozygous (HCC) 09/20/2012   PCOS (polycystic ovarian syndrome)    Lumbar herniated disc     Past Surgical History:  Procedure Laterality Date   CESAREAN SECTION     herniated disc surgery     WISDOM TOOTH EXTRACTION       OB History     Gravida  3   Para  2   Term      Preterm      AB      Living  2      SAB      IAB      Ectopic      Multiple      Live Births              Family History  Problem Relation Age of Onset   Cancer Other        melanoma    Hypertension Father    Melanoma Mother    Depression Sister    Hyperlipidemia Sister    Hyperlipidemia Brother    Stroke Maternal Grandmother 22   Early death Maternal Grandfather    Heart attack Maternal Grandfather    Diabetes Paternal Grandmother 64   Parkinson's disease Paternal Grandmother    Alzheimer's disease Paternal Grandfather     Social History   Tobacco Use   Smoking status: Never    Smokeless tobacco: Never   Tobacco comments:    NA  Vaping Use   Vaping Use: Never used  Substance Use Topics   Alcohol use: Yes    Comment: socially   Drug use: Never    Home Medications Prior to Admission medications   Medication Sig Start Date End Date Taking? Authorizing Provider  hydrOXYzine (ATARAX/VISTARIL) 25 MG tablet Take 1 tablet (25 mg total) by mouth every 6 (six) hours for 14 days. 03/04/21 03/18/21 Yes Donyea Gafford S, PA  ibuprofen (ADVIL) 200 MG tablet Take 200-400 mg by mouth every 6 (six) hours as needed for mild pain or headache.   Yes [provider]  rosuvastatin (CRESTOR) 5 MG tablet Take 1 tablet (5 mg total) by mouth daily. 08/28/20  Yes Koberlein, Junell C, MD  zolpidem (AMBIEN CR) 6.25 MG CR tablet Take 1 tablet (6.25 mg total) by mouth at bedtime as needed for sleep. 08/28/20  Yes Koberlein, Junell C, MD  traZODone (DESYREL) 100 MG tablet Take 0.5 tablets (50 mg total) by mouth at bedtime. 03/04/21   Gailen Shelter, PA  zaleplon (SONATA) 10 MG capsule 1 for sleep as needed Patient not taking: No sig reported 01/15/21   Jetty Duhamel D, MD    Allergies    Codeine  Review of Systems   Review of Systems  Constitutional:  Negative for chills and fever.  HENT:  Negative for congestion.   Eyes:  Negative for pain.  Respiratory:  Negative for cough and shortness of breath.   Cardiovascular:  Positive for chest pain. Negative for leg swelling.  Gastrointestinal:  Negative for abdominal pain and vomiting.  Genitourinary:  Negative for dysuria.  Musculoskeletal:  Negative for myalgias.  Skin:  Negative for rash.  Neurological:  Negative for dizziness and headaches.  Psychiatric/Behavioral:  Positive for agitation. Negative for suicidal ideas.    Physical Exam Updated Vital Signs BP 131/83   Pulse 63   Temp 97.9 F (36.6 C) (Oral)   Resp 18   SpO2 98%   Physical Exam Vitals and nursing note reviewed.  Constitutional:      General: She is  not in acute distress.    Appearance: She is obese.     Comments: Pleasant 50 year old female, obese, does not appear to be in any acute distress but seems somewhat anxious.  Able answer questions appropriately follow commands.  HENT:     Head: Normocephalic and atraumatic.     Nose: Nose normal.  Mouth/Throat:     Mouth: Mucous membranes are moist.  Eyes:     General: No scleral icterus. Cardiovascular:     Rate and Rhythm: Normal rate and regular rhythm.     Pulses: Normal pulses.     Heart sounds: Normal heart sounds.  Pulmonary:     Effort: Pulmonary effort is normal. No respiratory distress.     Breath sounds: No wheezing.  Abdominal:     Palpations: Abdomen is soft.     Tenderness: There is no abdominal tenderness. There is no guarding or rebound.  Musculoskeletal:     Cervical back: Normal range of motion.     Right lower leg: No edema.     Left lower leg: No edema.     Comments: Bilateral lower extremities are without edema but are large but symmetric.  Skin:    General: Skin is warm and dry.     Capillary Refill: Capillary refill takes less than 2 seconds.  Neurological:     Mental Status: She is alert. Mental status is at baseline.  Psychiatric:        Mood and Affect: Mood normal.        Behavior: Behavior normal.    ED Results / Procedures / Treatments   Labs (all labs ordered are listed, but only abnormal results are displayed) Labs Reviewed  BASIC METABOLIC PANEL - Abnormal; Notable for the following components:      Result Value   Glucose, Bld 107 (*)    All other components within normal limits  CBC - Abnormal; Notable for the following components:   WBC 10.6 (*)    All other components within normal limits  I-STAT BETA HCG BLOOD, ED ([facility], [facility], [facility] ONLY)  TROPONIN I (HIGH SENSITIVITY)  TROPONIN I (HIGH SENSITIVITY)    EKG EKG Interpretation  Date/Time:  Wednesday March 03 2021 20:18:20 EDT Ventricular Rate:  80 PR Interval:  178 QRS  Duration: 78 QT Interval:  382 QTC Calculation: 440 R Axis:   -1 Text Interpretation: Normal sinus rhythm Possible Anterolateral infarct , age undetermined Abnormal ECG Confirmed by Norman Clay ([id number]) on 03/03/2021 9:19:05 PM  Radiology DG Chest 2 View  Result Date: 03/03/2021 CLINICAL DATA:  Chest pain EXAM: CHEST - 2 VIEW COMPARISON:  None. FINDINGS: The heart size and mediastinal contours are within normal limits. Both lungs are clear. The visualized skeletal structures are unremarkable. IMPRESSION: No active cardiopulmonary disease. Electronically Signed   By: Deatra Robinson M.D.   On: 03/03/2021 21:19    Procedures Procedures   Medications Ordered in ED Medications  LORazepam (ATIVAN) tablet 1 mg (1 mg Oral Given 03/03/21 2343)  acetaminophen (TYLENOL) tablet 1,000 mg (1,000 mg Oral Given 03/03/21 2343)  aspirin chewable tablet 162 mg (162 mg Oral Given 03/03/21 2343)    ED Course  I have reviewed the triage vital signs and the nursing notes.  Pertinent labs & imaging results that were available during my care of the patient were reviewed by me and considered in my medical decision making (see chart for details).  Patient is a 50 year old female presented today with chest pain.  Is been ongoing for 2 weeks.  Seen to be worse today.  Very atypical sounding chest pain.  Not exertional pleuritic or associated with any shortness of breath diaphoresis nausea or vomiting.  Patient here score of 3.  Troponin x2 is flat at 4.  Clinical Course as of 03/04/21 0033  Wed Mar 03, 2021  2341 Chest  x-ray unremarkable with no pneumothorax, infiltrate, widened mediastinum or other acute abnormality. [WF]  2342 BMP unremarkable.  Normal kidney function.  CBC without leukocytosis of any significant note.  No anemia. [WF]  2342 Troponin X1 within normal limits of 4.  I-STAT Hg negative for pregnancy. [WF]    Clinical Course User Index [WF] Gailen ShelterFondaw, Imraan Wendell S, [place]   MDM Rules/Calculators/A&P HEAR  Score: 3                        Patient reassessed after Tylenol aspirin and lorazepam 1 mg p.o.  We had lengthy discussion about her symptoms.  It seems that sleep deprivation is causing a lot of the symptoms.  Pt states she has a follow up appointment tomorrow with PCP.  Will provide patient with 50 mg trazodone to use at bedtime as needed, Vistaril.  We will have patient follow-up with cardiology.  Harriet Butteanya Kovacevich Erney was evaluated in Emergency Department on 03/04/2021 for the symptoms described in the history of present illness. She was evaluated in the context of the global COVID-19 pandemic, which necessitated consideration that the patient might be at risk for infection with the SARS-CoV-2 virus that causes COVID-19. Institutional protocols and algorithms that pertain to the evaluation of patients at risk for COVID-19 are in a state of rapid change based on information released by regulatory bodies including the CDC and federal and state organizations. These policies and algorithms were followed during the patient's care in the ED.   Final Clinical Impression(s) / ED Diagnoses Final diagnoses:  Atypical chest pain  Insomnia, unspecified type    Rx / DC Orders ED Discharge Orders          Ordered    hydrOXYzine (ATARAX/VISTARIL) 25 MG tablet  Every 6 hours        03/04/21 0014    traZODone (DESYREL) 100 MG tablet  Daily at bedtime,   Status:  Discontinued        03/04/21 0030    traZODone (DESYREL) 100 MG tablet  Daily at bedtime        03/04/21 0030             Gailen ShelterFondaw, Tyr Franca S, [place] 03/04/21 0035    Cheryll CockayneHong, Joshua S, MD 03/05/21 [phone]

## 2021-03-04 LAB — TROPONIN I (HIGH SENSITIVITY): Troponin I (High Sensitivity): 4 ng/L (ref <18)

## 2021-03-04 MED ORDER — TRAZODONE HCL 100 MG PO TABS: 50.0000 mg | ORAL_TABLET | Freq: Every day | ORAL | 1 refills | Status: DC

## 2021-03-04 MED ORDER — TRAZODONE HCL 100 MG PO TABS
50.0000 mg | ORAL_TABLET | Freq: Every day | ORAL | 0 refills | Status: DC
Start: 2021-03-04 — End: 2021-06-16

## 2021-03-04 MED ORDER — HYDROXYZINE HCL 25 MG PO TABS: 25.0000 mg | ORAL_TABLET | Freq: Four times a day (QID) | ORAL | 0 refills | Status: AC

## 2021-03-04 NOTE — Discharge Instructions (Addendum)
Your work-up today was quite reassuring.  Your EKG was without any evidence of a heart attack.  You should follow-up with a cardiologist and activity you information for our [facility] cardiology team.  Please follow-up with them for assessment of your chest pain.  In the meantime I do have some suspicion that your symptoms may be related to anxiety.  I think it is very reasonable to start you on Vistaril.  Please follow-up with your primary care provider tomorrow to discuss treatment of anxiety with a first-line medication such as a selective serotonin reuptake inhibitor.  He Surgeons ER for any new or concerning symptoms.  You may take Tylenol 1000 mg every 6 hours for pain this will help with his musculoskeletal pain that is causing her symptoms.

## 2021-03-30 ENCOUNTER — Emergency Department (HOSPITAL_COMMUNITY)
Admission: EM | Admit: 2021-03-30 | Discharge: 2021-03-30 | Disposition: A | Discharge status: Home / Self Care | Payer: 59 | Attending: Emergency Medicine | Admitting: Emergency Medicine

## 2021-03-30 ENCOUNTER — Emergency Department (HOSPITAL_COMMUNITY): Payer: 59

## 2021-03-30 ENCOUNTER — Other Ambulatory Visit: Payer: Self-pay

## 2021-03-30 ENCOUNTER — Encounter (HOSPITAL_COMMUNITY): Payer: Self-pay

## 2021-03-30 DIAGNOSIS — N201 Calculus of ureter: Secondary | ICD-10-CM | POA: Insufficient documentation

## 2021-03-30 DIAGNOSIS — R1031 Right lower quadrant pain: Secondary | ICD-10-CM | POA: Diagnosis present

## 2021-03-30 LAB — I-STAT BETA HCG BLOOD, ED (MC, WL, AP ONLY): I-stat hCG, quantitative: <5 m[IU]/mL (ref <5)

## 2021-03-30 LAB — URINALYSIS, ROUTINE W REFLEX MICROSCOPIC
Bilirubin Urine: NEGATIVE
Glucose, UA: NEGATIVE mg/dL
Hgb urine dipstick: NEGATIVE
Ketones, ur: 80 mg/dL — AB
Leukocytes,Ua: NEGATIVE
Nitrite: NEGATIVE
Protein, ur: NEGATIVE mg/dL
Specific Gravity, Urine: 1.02 (ref 1.005–1.030)
pH: 6 (ref 5.0–8.0)

## 2021-03-30 LAB — LIPASE, BLOOD: Lipase: 28 U/L (ref 11–51)

## 2021-03-30 LAB — COMPREHENSIVE METABOLIC PANEL
ALT: 22 U/L (ref 0–44)
AST: 23 U/L (ref 15–41)
Albumin: 4.2 g/dL (ref 3.5–5.0)
Alkaline Phosphatase: 60 U/L (ref 38–126)
Anion gap: 11 (ref 5–15)
BUN: 17 mg/dL (ref 6–20)
CO2: 22 mmol/L (ref 22–32)
Calcium: 9.2 mg/dL (ref 8.9–10.3)
Chloride: 105 mmol/L (ref 98–111)
Creatinine, Ser: 1.01 mg/dL — ABNORMAL HIGH (ref 0.44–1.00)
GFR, Estimated: >60 mL/min (ref >60)
Glucose, Bld: 119 mg/dL — ABNORMAL HIGH (ref 70–99)
Potassium: 3.8 mmol/L (ref 3.5–5.1)
Sodium: 138 mmol/L (ref 135–145)
Total Bilirubin: 0.8 mg/dL (ref 0.3–1.2)
Total Protein: 7.7 g/dL (ref 6.5–8.1)

## 2021-03-30 LAB — CBC
HCT: 40.3 % (ref 36.0–46.0)
Hemoglobin: 13.3 g/dL (ref 12.0–15.0)
MCH: 29.8 pg (ref 26.0–34.0)
MCHC: 33 g/dL (ref 30.0–36.0)
MCV: 90.4 fL (ref 80.0–100.0)
Platelets: 317 10*3/uL (ref 150–400)
RBC: 4.46 MIL/uL (ref 3.87–5.11)
RDW: 12.8 % (ref 11.5–15.5)
WBC: 14 10*3/uL — ABNORMAL HIGH (ref 4.0–10.5)
nRBC: 0 % (ref 0.0–0.2)

## 2021-03-30 MED ORDER — TAMSULOSIN HCL 0.4 MG PO CAPS: 0.4000 mg | ORAL_CAPSULE | Freq: Every day | ORAL | 0 refills | Status: DC

## 2021-03-30 MED ORDER — SODIUM CHLORIDE 0.9 % IV BOLUS
1000.0000 mL | Freq: Once | INTRAVENOUS | Status: AC
  Administered 2021-03-30: 1000 mL via INTRAVENOUS

## 2021-03-30 MED ORDER — MORPHINE SULFATE (PF) 4 MG/ML IV SOLN
4.0000 mg | Freq: Once | INTRAVENOUS | Status: AC
  Administered 2021-03-30: 4 mg via INTRAVENOUS
  Filled 2021-03-30: qty 1

## 2021-03-30 MED ORDER — KETOROLAC TROMETHAMINE 30 MG/ML IJ SOLN
30.0000 mg | Freq: Once | INTRAMUSCULAR | Status: AC
  Administered 2021-03-30: 30 mg via INTRAVENOUS
  Filled 2021-03-30: qty 1

## 2021-03-30 MED ORDER — MORPHINE SULFATE (PF) 4 MG/ML IV SOLN: 4.0000 mg | Freq: Once | INTRAVENOUS | Status: DC

## 2021-03-30 MED ORDER — ONDANSETRON HCL 4 MG PO TABS: 4.0000 mg | ORAL_TABLET | Freq: Three times a day (TID) | ORAL | 0 refills | Status: DC | PRN

## 2021-03-30 MED ORDER — ONDANSETRON HCL 4 MG/2ML IJ SOLN
4.0000 mg | Freq: Once | INTRAMUSCULAR | Status: AC
  Administered 2021-03-30: 4 mg via INTRAVENOUS
  Filled 2021-03-30: qty 2

## 2021-03-30 MED ORDER — IOHEXOL 350 MG/ML SOLN
100.0000 mL | Freq: Once | INTRAVENOUS | Status: AC | PRN
  Administered 2021-03-30: 80 mL via INTRAVENOUS

## 2021-03-30 MED ORDER — SODIUM CHLORIDE (PF) 0.9 % IJ SOLN
INTRAMUSCULAR | Status: AC
  Filled 2021-03-30: qty 50

## 2021-03-30 MED ORDER — OXYCODONE-ACETAMINOPHEN 5-325 MG PO TABS: 1.0000 | ORAL_TABLET | Freq: Four times a day (QID) | ORAL | 0 refills | Status: DC | PRN

## 2021-03-30 NOTE — Discharge Instructions (Addendum)
You were seen in the emergency department for right-sided abdominal pain.  You had lab work urinalysis and a CAT scan of your abdomen and pelvis that showed a 1 mm kidney stone in the tube going from the kidney to the bladder.  This should pass on its own.  Please drink plenty of fluids.  We are prescribing some narcotic pain medicine to use as needed.  This may make you dizzy nauseous and constipated.  Please follow-up with urology.  Return to the emergency department if any uncontrolled pain or fever.

## 2021-03-30 NOTE — ED Provider Notes (Signed)
[facility]-EMERGENCY DEPT Provider Note   CSN: [id number] Arrival date & time: 03/30/21  0093     History Chief Complaint  Patient presents with   Abdominal Pain   Emesis    Rachel Ford is a 50 y.o. female.  She is here with a complaint of acute onset of right lower quadrant abdominal pain radiating into her back that started since midnight.  Associate with nausea and vomiting.  She had some yellow diarrhea last week and has not had much for bowel movements since then.  No urinary symptoms.  No vaginal bleeding or discharge.  Last menstrual period was about 1 month ago.  No fevers, has felt chilled though.   The history is provided by the patient.  Abdominal Pain Pain location:  RLQ Pain quality: aching   Pain radiates to:  Back Pain severity:  Severe Onset quality:  Sudden Duration:  10 hours Timing:  Constant Progression:  Unchanged Chronicity:  New Context: not recent travel, not sick contacts and not trauma   Relieved by:  Nothing Worsened by:  Movement Ineffective treatments:  None tried Associated symptoms: chills, nausea and vomiting   Associated symptoms: no chest pain, no constipation, no cough, no diarrhea, no dysuria, no fever, no hematemesis, no hematochezia, no hematuria, no shortness of breath, no sore throat, no vaginal bleeding and no vaginal discharge   Emesis Associated symptoms: abdominal pain and chills   Associated symptoms: no cough, no diarrhea, no fever, no headaches and no sore throat       Past Medical History:  Diagnosis Date   Depression    post partum   Factor 5 Leiden mutation, heterozygous (HCC) 09/20/2012   Hyperlipidemia    Lumbar herniated disc    Menorrhagia    OCD (obsessive compulsive disorder)    PCOS (polycystic ovarian syndrome)     Patient Active Problem List   Diagnosis Date Noted   Snoring 01/15/2021   Insomnia 01/15/2021   Hyperlipidemia 08/28/2020   Factor 5 Leiden mutation,  heterozygous (HCC) 09/20/2012   PCOS (polycystic ovarian syndrome)    Lumbar herniated disc     Past Surgical History:  Procedure Laterality Date   CESAREAN SECTION     CHOLECYSTECTOMY     herniated disc surgery     WISDOM TOOTH EXTRACTION       OB History     Gravida  3   Para  2   Term      Preterm      AB      Living  2      SAB      IAB      Ectopic      Multiple      Live Births              Family History  Problem Relation Age of Onset   Cancer Other        melanoma    Hypertension Father    Melanoma Mother    Depression Sister    Hyperlipidemia Sister    Hyperlipidemia Brother    Stroke Maternal Grandmother 66   Early death Maternal Grandfather    Heart attack Maternal Grandfather    Diabetes Paternal Grandmother 46   Parkinson's disease Paternal Grandmother    Alzheimer's disease Paternal Grandfather     Social History   Tobacco Use   Smoking status: Never   Smokeless tobacco: Never   Tobacco comments:    NA  Vaping  Use   Vaping Use: Never used  Substance Use Topics   Alcohol use: Yes    Comment: socially   Drug use: Never    Home Medications Prior to Admission medications   Medication Sig Start Date End Date Taking? Authorizing Provider  ibuprofen (ADVIL) 200 MG tablet Take 200-400 mg by mouth every 6 (six) hours as needed for mild pain or headache.    [provider]  rosuvastatin (CRESTOR) 5 MG tablet Take 1 tablet (5 mg total) by mouth daily. 08/28/20   Wynn BankerKoberlein, Junell C, MD  traZODone (DESYREL) 100 MG tablet Take 0.5 tablets (50 mg total) by mouth at bedtime. 03/04/21   Gailen ShelterFondaw, Wylder S, PA  zaleplon (SONATA) 10 MG capsule 1 for sleep as needed Patient not taking: No sig reported 01/15/21   Jetty DuhamelYoung, Clinton D, MD  zolpidem (AMBIEN CR) 6.25 MG CR tablet Take 1 tablet (6.25 mg total) by mouth at bedtime as needed for sleep. 08/28/20   Wynn BankerKoberlein, Junell C, MD    Allergies    Codeine  Review of Systems   Review  of Systems  Constitutional:  Positive for chills. Negative for fever.  HENT:  Negative for sore throat.   Eyes:  Negative for visual disturbance.  Respiratory:  Negative for cough and shortness of breath.   Cardiovascular:  Negative for chest pain.  Gastrointestinal:  Positive for abdominal pain, nausea and vomiting. Negative for constipation, diarrhea, hematemesis and hematochezia.  Genitourinary:  Negative for dysuria, hematuria, vaginal bleeding and vaginal discharge.  Musculoskeletal:  Positive for back pain.  Skin:  Negative for rash.  Neurological:  Negative for headaches.   Physical Exam Updated Vital Signs BP (!) 155/87 (BP Location: Left Arm)   Pulse (!) 57   Temp 98.1 F (36.7 C) (Oral)   Resp 18   Ht 5\' 7"  (1.702 m)   Wt 114.3 kg   LMP  (LMP Unknown)   SpO2 97%   BMI 39.47 kg/m   Physical Exam Vitals and nursing note reviewed.  Constitutional:      General: She is not in acute distress.    Appearance: Normal appearance. She is well-developed.  HENT:     Head: Normocephalic and atraumatic.  Eyes:     Conjunctiva/sclera: Conjunctivae normal.  Cardiovascular:     Rate and Rhythm: Normal rate and regular rhythm.     Heart sounds: No murmur heard. Pulmonary:     Effort: Pulmonary effort is normal. No respiratory distress.     Breath sounds: Normal breath sounds.  Abdominal:     General: There is no distension.     Palpations: Abdomen is soft.     Tenderness: There is abdominal tenderness (rlq). There is no guarding or rebound.     Hernia: No hernia is present.  Musculoskeletal:        General: Normal range of motion.     Cervical back: Neck supple.     Right lower leg: No edema.     Left lower leg: No edema.  Skin:    General: Skin is warm and dry.  Neurological:     General: No focal deficit present.     Mental Status: She is alert.    ED Results / Procedures / Treatments   Labs (all labs ordered are listed, but only abnormal results are  displayed) Labs Reviewed  COMPREHENSIVE METABOLIC PANEL - Abnormal; Notable for the following components:      Result Value   Glucose, Bld 119 (*)  Creatinine, Ser 1.01 (*)    All other components within normal limits  CBC - Abnormal; Notable for the following components:   WBC 14.0 (*)    All other components within normal limits  URINALYSIS, ROUTINE W REFLEX MICROSCOPIC - Abnormal; Notable for the following components:   Ketones, ur 80 (*)    All other components within normal limits  LIPASE, BLOOD  I-STAT BETA HCG BLOOD, ED ([facility], [facility], [facility] ONLY)    EKG None  Radiology CT Abdomen Pelvis W Contrast  Result Date: 03/30/2021 CLINICAL DATA:  Right lower quadrant abdominal pain. Appendicitis suspected. EXAM: CT ABDOMEN AND PELVIS WITH CONTRAST TECHNIQUE: Multidetector CT imaging of the abdomen and pelvis was performed using the standard protocol following bolus administration of intravenous contrast. CONTRAST:  50mL OMNIPAQUE IOHEXOL 350 MG/ML SOLN COMPARISON:  MRI 10/16/2016. FINDINGS: Lower chest: Normal Hepatobiliary: Previous cholecystectomy.  Liver appears normal. Pancreas: Normal Spleen: Normal Adrenals/Urinary Tract: Adrenal glands are normal. Left kidney is normal except for a nonobstructing 2 mm stone in the upper pole. Right kidney shows swelling with surrounding edema and hydroureteronephrosis. Several small nonobstructing stones within the right kidney, the largest measuring 3 mm. Exophytic cyst of the dorsal midportion. The right ureter is dilated all the way to the UVJ where there is a 1 mm stone about to pass into the bladder. Stomach/Bowel: Stomach and small intestine are normal. Appendix is normal. Colon shows diverticulosis but no evidence of diverticulitis per Vascular/Lymphatic: Normal Reproductive: Normal.  No pelvic mass. Other: No free fluid or air. Musculoskeletal: Ordinary lower lumbar degenerative changes. IMPRESSION: Hydroureteronephrosis on the right. 1 mm stone at the  right UVJ, about pass into the bladder. Several other small nonobstructing renal calculi on both sides, the largest on the right measuring 3 mm. Normal appendix. Previous cholecystectomy. Electronically Signed   By: Paulina Fusi M.D.   On: 03/30/2021 12:30    Procedures Procedures   Medications Ordered in ED Medications  sodium chloride (PF) 0.9 % injection (  Not Given 03/30/21 1202)  morphine 4 MG/ML injection 4 mg (4 mg Intravenous Patient Refused/Not Given 03/30/21 1303)  sodium chloride 0.9 % bolus 1,000 mL (0 mLs Intravenous Stopped 03/30/21 1303)  ondansetron (ZOFRAN) injection 4 mg (4 mg Intravenous Given 03/30/21 1042)  morphine 4 MG/ML injection 4 mg (4 mg Intravenous Given 03/30/21 1043)  iohexol (OMNIPAQUE) 350 MG/ML injection 100 mL (80 mLs Intravenous Contrast Given 03/30/21 1152)  ketorolac (TORADOL) 30 MG/ML injection 30 mg (30 mg Intravenous Given 03/30/21 1245)    ED Course  I have reviewed the triage vital signs and the nursing notes.  Pertinent labs & imaging results that were available during my care of the patient were reviewed by me and considered in my medical decision making (see chart for details).  Clinical Course as of 03/30/21 1715  Tue Mar 30, 2021  1241 Patient's pain is improving.  Her work-up is consistent with 1 mm distal right ureteral stone.  Reviewed this with her.  She is comfortable plan for discharge.  She has a ride.  Return instructions discussed [MB]    Clinical Course User Index [MB] Terrilee Files, MD   MDM Rules/Calculators/A&P                         This patient complains of acute onset of right lower quadrant pain nausea and vomiting; this involves an extensive number of treatment Options and is a complaint that carries with it  a high risk of complications and Morbidity. The differential includes renal colic, UTI, appendicitis, ovarian torsion  I ordered, reviewed and interpreted labs, which included CBC with elevated white count, stable  hemoglobin, chemistries and LFTs normal, pregnancy test negative, urinalysis with ketones, no signs of infection I ordered medication IV fluids pain medication nausea medication with improvement in her symptoms I ordered imaging studies which included CT abdomen and pelvis with contrast and I independently    visualized and interpreted imaging which showed right hydroureter and 1 mm distal stone, no evidence of appendicitis Additional history obtained from patient's husband Previous records obtained and reviewed in epic, no recent admissions  After the interventions stated above, I reevaluated the patient and found patient's pain to be controlled.  Reviewed results of work-up with her.  Have provided her with prescriptions for pain medicine nausea medication and Flomax.  Information for urology.  Return instructions discussed   Final Clinical Impression(s) / ED Diagnoses Final diagnoses:  Ureterolithiasis    Rx / DC Orders ED Discharge Orders          Ordered    oxyCODONE-acetaminophen (PERCOCET/ROXICET) 5-325 MG tablet  Every 6 hours PRN        03/30/21 1244    ondansetron (ZOFRAN) 4 MG tablet  Every 8 hours PRN        03/30/21 1244    tamsulosin (FLOMAX) 0.4 MG CAPS capsule  Daily        03/30/21 1244             Terrilee Files, MD 03/30/21 1718

## 2021-03-30 NOTE — ED Notes (Signed)
Pt c/o pain after "raising my arms up for the cat scan". Provider made aware. Pt's teeth chattering, states she thinks it might be an allergic reaction to the contrast, this RN reassured pt that chattering teeth are not s/s of allergix rxn. Pt VS are WNL, pt now states "the pain has subsided".

## 2021-03-30 NOTE — ED Triage Notes (Addendum)
Patient c/o  RLQ abdominal pain, and right lower back pain and emesis since midnight. Patient states she had yellow diarrhea the week before and states she is not sure if she has had a BM since.

## 2021-04-30 NOTE — Progress Notes (Deleted)
Date:  04/30/2021   ID:  Rachel Ford, DOB 04-26-1971, MRN [id number]  PCP:  Fatima Sanger, FNP  Cardiologist:  Tessa Lerner, DO, [facility]  (established care 05/03/2021) Former Cardiology Providers: ***  REASON FOR CONSULT: Chest pain   REQUESTING PHYSICIAN:  Wynn Banker, MD [address] [place],  [place] [address]  No chief complaint on file.   HPI  Rachel Ford is a 50 y.o. female who presents to the office with a chief complaint of "***." Patient's past medical history and cardiovascular risk factors include: ***  She is referred to the office at the request of Koberlein, Paris Lore, MD for evaluation of chest pain.  ***  History of  Denies prior history of coronary artery disease, myocardial infarction, congestive heart failure, deep venous thrombosis, pulmonary embolism, stroke, transient ischemic attack.  FUNCTIONAL STATUS: ***   ALLERGIES: Allergies  Allergen Reactions   Codeine Diarrhea and Nausea And Vomiting    MEDICATION LIST PRIOR TO VISIT: No outpatient medications have been marked as taking for the 05/03/21 encounter (Appointment) with Tessa Lerner, DO.     PAST MEDICAL HISTORY: Past Medical History:  Diagnosis Date   Depression    post partum   Factor 5 Leiden mutation, heterozygous (HCC) 09/20/2012   Hyperlipidemia    Lumbar herniated disc    Menorrhagia    OCD (obsessive compulsive disorder)    PCOS (polycystic ovarian syndrome)     PAST SURGICAL HISTORY: Past Surgical History:  Procedure Laterality Date   CESAREAN SECTION     CHOLECYSTECTOMY     herniated disc surgery     WISDOM TOOTH EXTRACTION      FAMILY HISTORY: The patient family history includes Alzheimer's disease in her paternal grandfather; Cancer in an other family member; Depression in her sister; Diabetes (age of onset: 3) in her paternal grandmother; Early death in her maternal grandfather; Heart attack in her maternal grandfather;  Hyperlipidemia in her brother and sister; Hypertension in her father; Melanoma in her mother; Parkinson's disease in her paternal grandmother; Stroke (age of onset: 69) in her maternal grandmother.  SOCIAL HISTORY:  The patient  reports that she has never smoked. She has never used smokeless tobacco. She reports current alcohol use. She reports that she does not use drugs.  REVIEW OF SYSTEMS: ROS  PHYSICAL EXAM: Vitals with BMI 03/30/2021 03/30/2021 03/30/2021  Height - - 5\' 7"   Weight - - 252 lbs  BMI - - 39.46  Systolic 149 144 -  Diastolic 73 95 -  Pulse 56 81 -    CONSTITUTIONAL: Well-developed and well-nourished. No acute distress.  SKIN: Skin is warm and dry. No rash noted. No cyanosis. No pallor. No jaundice HEAD: Normocephalic and atraumatic.  EYES: No scleral icterus MOUTH/THROAT: Moist oral membranes.  NECK: No JVD present. No thyromegaly noted. No carotid bruits  LYMPHATIC: No visible cervical adenopathy.  CHEST Normal respiratory effort. No intercostal retractions  LUNGS: *** No stridor. No wheezes. No rales.  CARDIOVASCULAR: *** ABDOMINAL: No apparent ascites.  EXTREMITIES: No peripheral edema  HEMATOLOGIC: No significant bruising NEUROLOGIC: Oriented to person, place, and time. Nonfocal. Normal muscle tone.  PSYCHIATRIC: Normal mood and affect. Normal behavior. Cooperative  CARDIAC DATABASE: EKG: ***  Echocardiogram: No results found for this or any previous visit from the past 1095 days.    Stress Testing: No results found for this or any previous visit from the past 1095 days.   Heart Catheterization: ***  LABORATORY DATA: CBC  Latest Ref Rng & Units 03/30/2021 03/03/2021 09/20/2016  WBC 4.0 - 10.5 K/uL 14.0(H) 10.6(H) 6.3  Hemoglobin 12.0 - 15.0 g/dL 85.4 62.7 03.5  Hematocrit 36.0 - 46.0 % 40.3 38.9 40.9  Platelets 150 - 400 K/uL 317 286 352    CMP Latest Ref Rng & Units 03/30/2021 03/03/2021 08/25/2020  Glucose 70 - 99 mg/dL 009(F) 818(E) 993(Z)  BUN  6 - 20 mg/dL 17 13 13   Creatinine 0.44 - 1.00 mg/dL ) 1.69(C 7.89  Sodium 135 - 145 mmol/L 138 139 139  Potassium 3.5 - 5.1 mmol/L 3.8 3.8 4.7  Chloride 98 - 111 mmol/L 105 107 104  CO2 22 - 32 mmol/L 22 24 29   Calcium 8.9 - 10.3 mg/dL 9.2 9.1 9.0  Total Protein 6.5 - 8.1 g/dL 7.7 - 6.5  Total Bilirubin 0.3 - 1.2 mg/dL 0.8 - 0.4  Alkaline Phos 38 - 126 U/L 60 - 75  AST 15 - 41 U/L 23 - 16  ALT 0 - 44 U/L 22 - 20    Lipid Panel     Component Value Date/Time   CHOL 152 08/25/2020 0741   TRIG 57.0 08/25/2020 0741   HDL 48.80 08/25/2020 0741   CHOLHDL 3 08/25/2020 0741   VLDL 11.4 08/25/2020 0741   LDLCALC 92 08/25/2020 0741    No components found for: NTPROBNP No results for input(s): PROBNP in the last 8760 hours. Recent Labs    08/25/20 0741  TSH 2.85    BMP Recent Labs    08/25/20 0741 03/03/21 2034 03/30/21 1050  NA 139 139 138  K 4.7 3.8 3.8  CL 104 107 105  CO2 29 24 22   GLUCOSE 103* 107* 119*  BUN 13 13 17   CREATININE 0.66 0.74 1.01*  CALCIUM 9.0 9.1 9.2  GFRNONAA  --  >60 >60    HEMOGLOBIN A1C Lab Results  Component Value Date   HGBA1C 5.7 08/25/2020    IMPRESSION:  No diagnosis found.   RECOMMENDATIONS: Rachel Ford is a 50 y.o. female whose past medical history and cardiac risk factors include: ***   FINAL MEDICATION LIST END OF ENCOUNTER: No orders of the defined types were placed in this encounter.   There are no discontinued medications.   Current Outpatient Medications:    ibuprofen (ADVIL) 200 MG tablet, Take 200-400 mg by mouth every 6 (six) hours as needed for mild pain or headache., Disp: , Rfl:    ondansetron (ZOFRAN) 4 MG tablet, Take 1 tablet (4 mg total) by mouth every 8 (eight) hours as needed for nausea or vomiting., Disp: 15 tablet, Rfl: 0   oxyCODONE-acetaminophen (PERCOCET/ROXICET) 5-325 MG tablet, Take 1 tablet by mouth every 6 (six) hours as needed for severe pain., Disp: 15 tablet, Rfl: 0    rosuvastatin (CRESTOR) 5 MG tablet, Take 1 tablet (5 mg total) by mouth daily., Disp: 90 tablet, Rfl: 3   tamsulosin (FLOMAX) 0.4 MG CAPS capsule, Take 1 capsule (0.4 mg total) by mouth daily., Disp: 30 capsule, Rfl: 0   traZODone (DESYREL) 100 MG tablet, Take 0.5 tablets (50 mg total) by mouth at bedtime. (Patient taking differently: Take 50 mg by mouth at bedtime as needed for sleep.), Disp: 30 tablet, Rfl: 0   zaleplon (SONATA) 10 MG capsule, 1 for sleep as needed (Patient not taking: No sig reported), Disp: 30 capsule, Rfl: 3   zolpidem (AMBIEN CR) 6.25 MG CR tablet, Take 1 tablet (6.25 mg total) by mouth at bedtime as needed for  sleep., Disp: 30 tablet, Rfl: 0  No orders of the defined types were placed in this encounter.   There are no Patient Instructions on file for this visit.   --Continue cardiac medications as reconciled in final medication list. --No follow-ups on file. Or sooner if needed. --Continue follow-up with your primary care physician regarding the management of your other chronic comorbid conditions.  Patient's questions and concerns were addressed to her satisfaction. She voices understanding of the instructions provided during this encounter.   This note was created using a voice recognition software as a result there may be grammatical errors inadvertently enclosed that do not reflect the nature of this encounter. Every attempt is made to correct such errors.  Tessa Lerner, [place], [facility]  Pager: [phone] Office: [phone]

## 2021-05-03 ENCOUNTER — Ambulatory Visit: Payer: Self-pay | Admitting: Cardiology

## 2021-05-05 ENCOUNTER — Telehealth: Payer: Self-pay | Admitting: Internal Medicine

## 2021-05-05 DIAGNOSIS — R0683 Snoring: Secondary | ICD-10-CM

## 2021-05-05 NOTE — Telephone Encounter (Signed)
CY this pt was seen by you on 01/15/21 and in her AVS was said to schedule HST.  It looks as if this was not done.  Are you ok for this to be ordered now?  Or will she need another OV?  Please advise. Thanks

## 2021-05-05 NOTE — Telephone Encounter (Signed)
Sorry it was missed- don't know what happened. Please order HST for dx snoring and ask her to ccall 2 weeks after study for report and recommendation.

## 2021-05-05 NOTE — Telephone Encounter (Signed)
I have attempted to call the pt but the VM is full and could not accept my call.  I have placed the order for the home sleep test to be done.

## 2021-05-07 ENCOUNTER — Ambulatory Visit: Payer: 59 | Admitting: Internal Medicine

## 2021-05-26 ENCOUNTER — Ambulatory Visit: Payer: 59 | Admitting: Cardiology

## 2021-06-16 ENCOUNTER — Other Ambulatory Visit: Payer: Self-pay

## 2021-06-16 ENCOUNTER — Ambulatory Visit: Payer: 59 | Admitting: Cardiology

## 2021-06-16 ENCOUNTER — Encounter: Payer: Self-pay | Admitting: Cardiology

## 2021-06-16 VITALS — BP 139/83 | HR 75 | Temp 98.4°F | Resp 16 | Ht 67.0 in | Wt 256.0 lb

## 2021-06-16 DIAGNOSIS — Z8249 Family history of ischemic heart disease and other diseases of the circulatory system: Secondary | ICD-10-CM

## 2021-06-16 DIAGNOSIS — E282 Polycystic ovarian syndrome: Secondary | ICD-10-CM

## 2021-06-16 DIAGNOSIS — R072 Precordial pain: Secondary | ICD-10-CM

## 2021-06-16 DIAGNOSIS — E782 Mixed hyperlipidemia: Secondary | ICD-10-CM

## 2021-06-16 DIAGNOSIS — R7303 Prediabetes: Secondary | ICD-10-CM

## 2021-06-16 NOTE — Progress Notes (Signed)
Date:  06/16/2021   ID:  Rachel Ford, DOB 1971/02/17, MRN [id number]  PCP:  Fatima Sanger, FNP  Cardiologist:  Tessa Lerner, DO, [facility] (established care 06/16/21) Former Cardiology Providers: Dr. Yates Decamp  REASON FOR CONSULT: Chest Pain   REQUESTING PHYSICIAN:  Lauretta Chester, FNP  Chief Complaint  Patient presents with   Chest Pain   New Patient (Initial Visit)    Referred by Dr. Renne Crigler    HPI  Rachel Ford is a 50 y.o. female who presents to the office with a chief complaint of " chest pain." Patient's past medical history and cardiovascular risk factors include: History of severe anxiety, PTSD, PCOS, factor V Leiden +2013, hyperlipidemia, prediabetes, family history of coronary artery disease, obesity due to excess calories, history of COVID-19 infection.  She is referred to the office at the request of Lauretta Chester, FNP for evaluation of chest pain.  Chest pain: Located left-sided, present for the last several years, the worst episode was in June 2022.  That episode was described as ache/dull-like sensation over the left anterior precordial chest, accompanied with bilateral arm heaviness, diaphoresis, lightheaded and dizziness.  The discomfort was not brought on by effort related activities and did not resolve with rest.  Because it was the worst discomfort she experienced she went to the ED and had further work-up.  High sensitive troponins were negative x2.  Last episode of chest pain was today with similar characteristics as described above.  Overall patient states that her symptoms are very similar to when she was diagnosed as costochondritis.  The discomfort is less intense in comparison to her cardiovascular work-up in 2017.  Patient cannot comment on any change in physical endurance as she does not participate in a structured exercise program or daily routine due to her back pain and radiculopathy.  Maternal grandfather passed away at the age  of 33 due to myocardial infarction.  FUNCTIONAL STATUS: No structured exercise program or daily routine limited to back pain and radiculopathy  ALLERGIES: Allergies  Allergen Reactions   Codeine Diarrhea and Nausea And Vomiting    MEDICATION LIST PRIOR TO VISIT: Current Meds  Medication Sig   rosuvastatin (CRESTOR) 5 MG tablet Take 1 tablet (5 mg total) by mouth daily. (Patient taking differently: Take 10 mg by mouth daily.)     PAST MEDICAL HISTORY: Past Medical History:  Diagnosis Date   Depression    post partum   Factor 5 Leiden mutation, heterozygous (HCC) 09/20/2012   Hyperlipidemia    Lumbar herniated disc    Menorrhagia    OCD (obsessive compulsive disorder)    PCOS (polycystic ovarian syndrome)     PAST SURGICAL HISTORY: Past Surgical History:  Procedure Laterality Date   CESAREAN SECTION     CHOLECYSTECTOMY     herniated disc surgery     WISDOM TOOTH EXTRACTION      FAMILY HISTORY: The patient family history includes Alzheimer's disease in her paternal grandfather; Cancer in an other family member; Depression in her sister; Diabetes (age of onset: 57) in her paternal grandmother; Early death in her maternal grandfather; Heart attack in her maternal grandfather; Hyperlipidemia in her brother and sister; Hypertension in her father; Melanoma in her mother; Parkinson's disease in her paternal grandmother; Stroke (age of onset: 21) in her maternal grandmother.  SOCIAL HISTORY:  The patient  reports that she has never smoked. She has never used smokeless tobacco. She reports current alcohol use. She reports that she does not  use drugs.  REVIEW OF SYSTEMS: Review of Systems  Constitutional: Negative for chills and fever.  HENT:  Negative for hoarse voice and nosebleeds.   Eyes:  Negative for discharge, double vision and pain.  Cardiovascular:  Positive for chest pain. Negative for claudication, dyspnea on exertion, leg swelling, near-syncope, orthopnea,  palpitations, paroxysmal nocturnal dyspnea and syncope.  Respiratory:  Negative for hemoptysis and shortness of breath.   Musculoskeletal:  Negative for muscle cramps and myalgias.  Gastrointestinal:  Negative for abdominal pain, constipation, diarrhea, hematemesis, hematochezia, melena, nausea and vomiting.  Neurological:  Negative for dizziness and light-headedness.   PHYSICAL EXAM: Vitals with BMI 06/16/2021 03/30/2021 03/30/2021  Height 5\' 7"  - -  Weight 256 lbs - -  BMI 40.09 - -  Systolic 139 149  Diastolic 83 73 95  Pulse 75 56 81    CONSTITUTIONAL: Well-developed and well-nourished. No acute distress.  SKIN: Skin is warm and dry. No rash noted. No cyanosis. No pallor. No jaundice HEAD: Normocephalic and atraumatic.  EYES: No scleral icterus MOUTH/THROAT: Moist oral membranes.  NECK: No JVD present. No thyromegaly noted. No carotid bruits  LYMPHATIC: No visible cervical adenopathy.  CHEST Normal respiratory effort. No intercostal retractions  LUNGS: Clear to auscultation bilaterally. No stridor. No wheezes. No rales.  CARDIOVASCULAR: Regular rate and rhythm, positive S1-S2, no murmurs rubs or gallops appreciated. ABDOMINAL: Obese, soft, nontender, nondistended, positive bowel sounds all 4 quadrants. No apparent ascites.  EXTREMITIES: No peripheral edema  HEMATOLOGIC: No significant bruising NEUROLOGIC: Oriented to person, place, and time. Nonfocal. Normal muscle tone.  PSYCHIATRIC: Normal mood and affect. Normal behavior. Cooperative  CARDIAC DATABASE: EKG: 06/16/2021: Normal sinus rhythm, 68 bpm, low voltage in the precordial leads, without underlying ischemia or injury pattern.  Echocardiogram: No results found for this or any previous visit from the past 1095 days.  Stress Testing: Exercise treadmill stress test 12/2015 (per report: Patient exercised for 6 minutes, achieved 90% of maximum predicted heart rate, 7.05 METS, normal stress ECG  Heart  Catheterization: None  LABORATORY DATA: CBC Latest Ref Rng & Units 03/30/2021 03/03/2021 09/20/2016  WBC 4.0 - 10.5 K/uL 14.0(H) 10.6(H) 6.3  Hemoglobin 12.0 - 15.0 g/dL 11/18/2016 76.2 83.1  Hematocrit 36.0 - 46.0 % 40.3 38.9 40.9  Platelets 150 - 400 K/uL 317 286 352    CMP Latest Ref Rng & Units 03/30/2021 03/03/2021 08/25/2020  Glucose 70 - 99 mg/dL 14/03/2020) 616(W) 737(T)  BUN 6 - 20 mg/dL 17 13 13   Creatinine 0.44 - 1.00 mg/dL 062(I) 9.48(N  Sodium 135 - 145 mmol/L 138 139 139  Potassium 3.5 - 5.1 mmol/L 3.8 3.8 4.7  Chloride 98 - 111 mmol/L 105 107 104  CO2 22 - 32 mmol/L 22 24 29   Calcium 8.9 - 10.3 mg/dL 9.2 9.1 9.0  Total Protein 6.5 - 8.1 g/dL 7.7 - 6.5  Total Bilirubin 0.3 - 1.2 mg/dL 0.8 - 0.4  Alkaline Phos 38 - 126 U/L 60 - 75  AST 15 - 41 U/L 23 - 16  ALT 0 - 44 U/L 22 - 20    Lipid Panel     Component Value Date/Time   CHOL 152 08/25/2020 0741   TRIG 57.0 08/25/2020 0741   HDL 48.80 08/25/2020 0741   CHOLHDL 3 08/25/2020 0741   VLDL 11.4 08/25/2020 0741   LDLCALC 92 08/25/2020 0741    No components found for: NTPROBNP No results for input(s): PROBNP in the last 8760 hours. Recent Labs  08/25/20 0741  TSH 2.85    BMP Recent Labs    08/25/20 0741 03/03/21 2034 03/30/21 1050  NA 139 139 138  K 4.7 3.8 3.8  CL 104 107 105  CO2 29 24 22   GLUCOSE 103* 107* 119*  BUN 13 13 17   CREATININE 0.66 0.74 1.01*  CALCIUM 9.0 9.1 9.2  GFRNONAA  --  >60 >60    HEMOGLOBIN A1C Lab Results  Component Value Date   HGBA1C 5.7 08/25/2020    IMPRESSION:    ICD-10-CM   1. Precordial pain  R07.2 EKG 12-Lead    PCV ECHOCARDIOGRAM COMPLETE    CT CARDIAC SCORING ([facility] LOCATIONS ONLY)    PCV CARDIAC STRESS TEST    2. Mixed hyperlipidemia  E78.2     3. Prediabetes  R73.03     4. PCOS (polycystic ovarian syndrome)  E28.2     5. Family history of heart disease  Z82.49        RECOMMENDATIONS: Rachel Ford is a 50 y.o. female whose past medical  history and cardiac risk factors include: History of severe anxiety, PTSD, PCOS, factor V Leiden +2013, hyperlipidemia, prediabetes, family history of coronary artery disease, obesity due to excess calories, history of COVID-19 infection.  Precordial pain Suggestive of noncardiac discomfort based on symptoms. ECG nonischemic. Echocardiogram will be ordered to evaluate for structural heart disease and left ventricular systolic function. Plan GXT to evaluate for exercise-induced ischemia and functional status. Coronary calcium score for further risk stratification  Mixed hyperlipidemia Currently on Crestor.   She denies myalgia or other side effects. Most recent lipids dated 08/25/2020 reviewed as noted above. Currently managed by primary care provider.  FINAL MEDICATION LIST END OF ENCOUNTER: No orders of the defined types were placed in this encounter.    Current Outpatient Medications:    rosuvastatin (CRESTOR) 5 MG tablet, Take 1 tablet (5 mg total) by mouth daily. (Patient taking differently: Take 10 mg by mouth daily.), Disp: 90 tablet, Rfl: 3  Orders Placed This Encounter  Procedures   CT CARDIAC SCORING ([facility] LOCATIONS ONLY)   PCV CARDIAC STRESS TEST   EKG 12-Lead   PCV ECHOCARDIOGRAM COMPLETE    There are no Patient Instructions on file for this visit.   --Continue cardiac medications as reconciled in final medication list. --Return in about 4 weeks (around 07/14/2021) for Follow up, Chest pain. Or sooner if needed. --Continue follow-up with your primary care physician regarding the management of your other chronic comorbid conditions.  Patient's questions and concerns were addressed to her satisfaction. She voices understanding of the instructions provided during this encounter.   This note was created using a voice recognition software as a result there may be grammatical errors inadvertently enclosed that do not reflect the nature of this encounter. Every attempt is made  to correct such errors.  14/03/2020, 07/16/2021, [facility]  Pager: [phone] Office: [phone]

## 2021-06-30 ENCOUNTER — Telehealth: Payer: Self-pay | Admitting: Internal Medicine

## 2021-06-30 NOTE — Telephone Encounter (Signed)
It looks like Rachel Ford has picked up this order to call I will have her call pt once she is back in office on thursday 07/01/21

## 2021-07-01 ENCOUNTER — Other Ambulatory Visit: Payer: 59

## 2021-07-01 ENCOUNTER — Other Ambulatory Visit: Payer: Self-pay

## 2021-07-01 ENCOUNTER — Ambulatory Visit: Payer: 59

## 2021-07-01 DIAGNOSIS — R072 Precordial pain: Secondary | ICD-10-CM

## 2021-07-02 NOTE — Telephone Encounter (Signed)
I have called pt & left her a vm to call me back.

## 2021-07-07 NOTE — Telephone Encounter (Signed)
Left pt another vm to call me for hst.

## 2021-07-13 ENCOUNTER — Encounter: Payer: Self-pay | Admitting: Internal Medicine

## 2021-07-13 NOTE — Telephone Encounter (Signed)
Tried to call pt again for hst & vm is full.  Mailed letter.  Nothing further needed.

## 2021-07-14 ENCOUNTER — Ambulatory Visit
Admission: RE | Admit: 2021-07-14 | Discharge: 2021-07-14 | Disposition: A | Discharge status: Home / Self Care | Payer: No Typology Code available for payment source | Source: Ambulatory Visit | Attending: Cardiology | Admitting: Cardiology

## 2021-07-14 DIAGNOSIS — R072 Precordial pain: Secondary | ICD-10-CM

## 2021-07-16 NOTE — Progress Notes (Signed)
Called and spoke to pt, pt voiced understanding.

## 2021-07-19 ENCOUNTER — Ambulatory Visit: Payer: 59 | Admitting: Cardiology

## 2021-08-27 ENCOUNTER — Other Ambulatory Visit: Payer: Self-pay

## 2021-08-27 ENCOUNTER — Ambulatory Visit: Payer: 59

## 2021-08-27 DIAGNOSIS — R072 Precordial pain: Secondary | ICD-10-CM

## 2021-09-02 ENCOUNTER — Ambulatory Visit: Payer: 59 | Admitting: Cardiology

## 2021-09-02 ENCOUNTER — Other Ambulatory Visit: Payer: Self-pay

## 2021-09-02 ENCOUNTER — Encounter: Payer: Self-pay | Admitting: Cardiology

## 2021-09-02 VITALS — BP 144/84 | HR 81 | Temp 98.1°F | Resp 16 | Ht 67.0 in | Wt 261.0 lb

## 2021-09-02 DIAGNOSIS — Z712 Person consulting for explanation of examination or test findings: Secondary | ICD-10-CM

## 2021-09-02 DIAGNOSIS — R7303 Prediabetes: Secondary | ICD-10-CM

## 2021-09-02 DIAGNOSIS — E782 Mixed hyperlipidemia: Secondary | ICD-10-CM

## 2021-09-02 DIAGNOSIS — R072 Precordial pain: Secondary | ICD-10-CM

## 2021-09-02 DIAGNOSIS — Z8249 Family history of ischemic heart disease and other diseases of the circulatory system: Secondary | ICD-10-CM

## 2021-09-02 DIAGNOSIS — I340 Nonrheumatic mitral (valve) insufficiency: Secondary | ICD-10-CM

## 2021-09-02 NOTE — Progress Notes (Signed)
Date:  09/02/2021   ID:  Rachel Ford, DOB 01-Dec-1970, MRN [id number]  PCP:  Rachel Margarita, DO  Cardiologist:  Rex Kras, DO, [facility] (established care 06/16/21) Former Cardiology Providers: Dr. Adrian Prows  Date: 09/02/21 Last Office Visit: 06/16/2021  Chief Complaint  Patient presents with   Chest Pain   Follow-up    4 weeks    HPI  Rachel Ford is a 50 y.o. female who presents to the office with a chief complaint of " reevaluation of chest pain and discuss test results." Patient's past medical history and cardiovascular risk factors include: History of severe anxiety, PTSD, PCOS, factor V Leiden +2013, hyperlipidemia, prediabetes, family history of coronary artery disease, obesity due to excess calories, history of COVID-19 infection.  She is referred to the office at the request of Thedora Hinders, FNP for evaluation of chest pain.  During initial consultation patient's description of chest pain appear to be noncardiac however given her risk factors including premature CAD in the family and the shared decision was to proceed with additional work-up.  Patient is noted to have a total calcium score of 0, LVEF is preserved, no significant valvular heart disease, and exercise treadmill stress test reported to be low risk.  Clinically she continues to have localized chest pain over the left breast, ache-like sensation, chronic and stable, 3 out of 10 in intensity, nonexertional, and does not resolve with rest.  She has not increased her physical activity since last office visit.  She is currently in physical therapy and ongoing and working on a stationary bike.  FUNCTIONAL STATUS: No structured exercise program or daily routine limited to back pain and radiculopathy  ALLERGIES: Allergies  Allergen Reactions   Codeine Diarrhea and Nausea And Vomiting    MEDICATION LIST PRIOR TO VISIT: No outpatient medications have been marked as taking for the 09/02/21  encounter (Office Visit) with Rex Kras, DO.     PAST MEDICAL HISTORY: Past Medical History:  Diagnosis Date   Depression    post partum   Factor 5 Leiden mutation, heterozygous ([place]) 09/20/2012   Hyperlipidemia    Lumbar herniated disc    Menorrhagia    OCD (obsessive compulsive disorder)    PCOS (polycystic ovarian syndrome)     PAST SURGICAL HISTORY: Past Surgical History:  Procedure Laterality Date   CESAREAN SECTION     CHOLECYSTECTOMY     herniated disc surgery     WISDOM TOOTH EXTRACTION      FAMILY HISTORY: The patient family history includes Alzheimer's disease in her paternal grandfather; Cancer in an other family member; Depression in her sister; Diabetes (age of onset: 77) in her paternal grandmother; Early death in her maternal grandfather; Heart attack in her maternal grandfather; Hyperlipidemia in her brother and sister; Hypertension in her father; Melanoma in her mother; Parkinson's disease in her paternal grandmother; Stroke (age of onset: 51) in her maternal grandmother.  SOCIAL HISTORY:  The patient  reports that she has never smoked. She has never used smokeless tobacco. She reports current alcohol use. She reports that she does not use drugs.  REVIEW OF SYSTEMS: Review of Systems  Constitutional: Negative for chills and fever.  HENT:  Negative for hoarse voice and nosebleeds.   Eyes:  Negative for discharge, double vision and pain.  Cardiovascular:  Positive for chest pain (non-cardiac, chronic and stable, for years). Negative for claudication, dyspnea on exertion, leg swelling, near-syncope, orthopnea, palpitations, paroxysmal nocturnal dyspnea and syncope.  Respiratory:  Negative  for hemoptysis and shortness of breath.   Musculoskeletal:  Negative for muscle cramps and myalgias.  Gastrointestinal:  Negative for abdominal pain, constipation, diarrhea, hematemesis, hematochezia, melena, nausea and vomiting.  Neurological:  Negative for dizziness and  light-headedness.   PHYSICAL EXAM: Vitals with BMI 09/02/2021 09/02/2021 06/16/2021  Height - 5\' 7"  5\' 7"   Weight - 261 lbs 256 lbs  BMI - [id number]  Systolic [id number]  Diastolic 84 84 83  Pulse 81 71 75    CONSTITUTIONAL: Well-developed and well-nourished. No acute distress.  SKIN: Skin is warm and dry. No rash noted. No cyanosis. No pallor. No jaundice HEAD: Normocephalic and atraumatic.  EYES: No scleral icterus MOUTH/THROAT: Moist oral membranes.  NECK: No JVD present. No thyromegaly noted. No carotid bruits  LYMPHATIC: No visible cervical adenopathy.  CHEST Normal respiratory effort. No intercostal retractions  LUNGS: Clear to auscultation bilaterally. No stridor. No wheezes. No rales.  CARDIOVASCULAR: Regular rate and rhythm, positive S1-S2, no murmurs rubs or gallops appreciated. ABDOMINAL: Obese, soft, nontender, nondistended, positive bowel sounds all 4 quadrants. No apparent ascites.  EXTREMITIES: No peripheral edema  HEMATOLOGIC: No significant bruising NEUROLOGIC: Oriented to person, place, and time. Nonfocal. Normal muscle tone.  PSYCHIATRIC: Normal mood and affect. Normal behavior. Cooperative  CARDIAC DATABASE: EKG: 06/16/2021: Normal sinus rhythm, 68 bpm, low voltage in the precordial leads, without underlying ischemia or injury pattern.  Echocardiogram: 07/01/2021: Left ventricle cavity is normal in size and wall thickness. Normal global wall motion. Normal LV systolic function with EF 68%. Normal diastolic filling pattern. Mild (Grade I) mitral regurgitation. Mild tricuspid regurgitation. No evidence of pulmonary hypertension.  Stress Testing: Exercise treadmill stress test 08/27/2021: Functional status: Fair.  Chest pain: No.  Reason for stopping exercise: Fatigue/weakness/leg pain.  Hypertensive response to exercise: No.  Exercise time 6 minutes 03 seconds on Bruce protocol, achieved 7.09 METS, 88% of age-predicted maximum heart rate.  Stress ECG  negative for ischemia.  Low risk study.  Coronary calcium scoring 07/14/2021: Total CAC 0  Heart Catheterization: None  LABORATORY DATA: CBC Latest Ref Rng & Units 03/30/2021 03/03/2021 09/20/2016  WBC 4.0 - 10.5 K/uL 14.0(H) 10.6(H) 6.3  Hemoglobin 12.0 - 15.0 g/dL 13.3 12.5 13.3  Hematocrit 36.0 - 46.0 % 40.3 38.9 40.9  Platelets 150 - 400 K/uL 317 286 352    CMP Latest Ref Rng & Units 03/30/2021 03/03/2021 08/25/2020  Glucose 70 - 99 mg/dL 119(H) 107(H) 103(H)  BUN 6 - 20 mg/dL 17 13 13   Creatinine 0.44 - 1.00 mg/dL 1.01(H) 0.74 0.66  Sodium 135 - 145 mmol/L 138 139 139  Potassium 3.5 - 5.1 mmol/L 3.8 3.8 4.7  Chloride 98 - 111 mmol/L 105 107 104  CO2 22 - 32 mmol/L 22 24 29   Calcium 8.9 - 10.3 mg/dL 9.2 9.1 9.0  Total Protein 6.5 - 8.1 g/dL 7.7 - 6.5  Total Bilirubin 0.3 - 1.2 mg/dL 0.8 - 0.4  Alkaline Phos 38 - 126 U/L 60 - 75  AST 15 - 41 U/L 23 - 16  ALT 0 - 44 U/L 22 - 20    Lipid Panel     Component Value Date/Time   CHOL 152 08/25/2020 0741   TRIG 57.0 08/25/2020 0741   HDL 48.80 08/25/2020 0741   CHOLHDL 3 08/25/2020 0741   VLDL 11.4 08/25/2020 0741   LDLCALC 92 08/25/2020 0741    No components found for: NTPROBNP No results for input(s): PROBNP in the last 8760 hours. No results for  input(s): TSH in the last 8760 hours.   BMP Recent Labs    03/03/21 2034 03/30/21 1050  NA 139 138  K 3.8 3.8  CL 107 105  CO2 24 22  GLUCOSE 107* 119*  BUN 13 17  CREATININE 0.74 1.01*  CALCIUM 9.1 9.2  GFRNONAA >60 >60    HEMOGLOBIN A1C Lab Results  Component Value Date   HGBA1C 5.7 08/25/2020    IMPRESSION:    ICD-10-CM   1. Precordial pain  R07.2     2. Mixed hyperlipidemia  E78.2     3. Prediabetes  R73.03     4. Family history of heart disease  Z82.49     5. Encounter to discuss test results  Z71.2        RECOMMENDATIONS: Alda Gaultney is a 50 y.o. female whose past medical history and cardiac risk factors include: History of  severe anxiety, PTSD, PCOS, factor V Leiden +2013, hyperlipidemia, prediabetes, family history of coronary artery disease, obesity due to excess calories, history of COVID-19 infection.  Precordial pain Noncardiac. Coronary calcium score: 0. Echocardiogram: LVEF preserved, normal diastolic function, mild MR/TR GXT: Low risk study Have asked the patient to follow-up with PCP to evaluate for noncardiac causes of chest pain.  She may try NSAIDs/Tylenol for possible costochondritis, consider mammogram if not performed, etc. Educated on the importance of improving her modifiable cardiovascular risk factors.  Mixed hyperlipidemia Continue Crestor. Currently managed by primary care provider.  Mitral regurgitation Patient is noted to have mild mitral regurgitation on recent echocardiogram.   I suspect this probably secondary to elevated blood pressures. She does not check her blood pressures at home but when it is checked it is usually around 140 mmHg or higher. I have asked her to keep a log of her blood pressures and to review it with PCP.   Would recommend a goal systolic blood pressure around 120 mmHg unable to tolerate. Reemphasized the importance of a low-salt diet. Recommend repeating echocardiogram in 3 years to reevaluate severity of MR.  FINAL MEDICATION LIST END OF ENCOUNTER: No orders of the defined types were placed in this encounter.    Current Outpatient Medications:    rosuvastatin (CRESTOR) 5 MG tablet, Take 1 tablet (5 mg total) by mouth daily. (Patient taking differently: Take 10 mg by mouth daily.), Disp: 90 tablet, Rfl: 3  No orders of the defined types were placed in this encounter.   There are no Patient Instructions on file for this visit.   --Continue cardiac medications as reconciled in final medication list. --Return in about 1 year (around 09/02/2022) for Follow up primary prevention. . Or sooner if needed. --Continue follow-up with your primary care physician  regarding the management of your other chronic comorbid conditions.  Patient's questions and concerns were addressed to her satisfaction. She voices understanding of the instructions provided during this encounter.   This note was created using a voice recognition software as a result there may be grammatical errors inadvertently enclosed that do not reflect the nature of this encounter. Every attempt is made to correct such errors.  Tessa Lerner, [place], [facility]  Pager: [phone] Office: [phone]

## 2021-12-01 ENCOUNTER — Other Ambulatory Visit: Payer: Self-pay | Admitting: Internal Medicine

## 2021-12-01 DIAGNOSIS — Z1231 Encounter for screening mammogram for malignant neoplasm of breast: Secondary | ICD-10-CM

## 2022-09-02 ENCOUNTER — Ambulatory Visit: Payer: 59 | Admitting: Cardiology

## 2022-10-09 NOTE — Progress Notes (Deleted)
Date:  10/09/2022   ID:  [name], DOB 06/23/1971, MRN [id number]  PCP:  Sueanne Margarita, DO  Cardiologist:  Rex Kras, DO, [facility] (established care 06/16/21) Former Cardiology Providers: Dr. Adrian Prows  Date: 10/09/22 Last Office Visit: 09/02/2021  No chief complaint on file.   HPI  Rachel Ford is a 52 y.o. female whose past medical history and cardiovascular risk factors include: History of severe anxiety, PTSD, PCOS, factor V Leiden +2013, hyperlipidemia, prediabetes, family history of coronary artery disease, obesity due to excess calories, history of COVID-19 infection.  She is referred to the office at the request of Thedora Hinders, FNP for evaluation of chest pain.  She was referred to the clinic for evaluation of chest pain and workup at that time noted total CAC of 0, LVEF was preserved, no significant valvular heart disease, and exercise treadmill stress test reported to be low risk.  She now presents for 1 year follow up. ***  Precharted HPI  FUNCTIONAL STATUS: No structured exercise program or daily routine limited to back pain and radiculopathy  ALLERGIES: Allergies  Allergen Reactions   Codeine Diarrhea and Nausea And Vomiting    MEDICATION LIST PRIOR TO VISIT: No outpatient medications have been marked as taking for the 10/10/22 encounter (Appointment) with Terri Skains, Stephannie Broner, DO.     PAST MEDICAL HISTORY: Past Medical History:  Diagnosis Date   Depression    post partum   Factor 5 Leiden mutation, heterozygous ([place]) 09/20/2012   Hyperlipidemia    Lumbar herniated disc    Menorrhagia    OCD (obsessive compulsive disorder)    PCOS (polycystic ovarian syndrome)     PAST SURGICAL HISTORY: Past Surgical History:  Procedure Laterality Date   CESAREAN SECTION     CHOLECYSTECTOMY     herniated disc surgery     WISDOM TOOTH EXTRACTION      FAMILY HISTORY: The patient family history includes Alzheimer's disease in her paternal  grandfather; Cancer in an other family member; Depression in her sister; Diabetes (age of onset: 55) in her paternal grandmother; Early death in her maternal grandfather; Heart attack in her maternal grandfather; Hyperlipidemia in her brother and sister; Hypertension in her father; Melanoma in her mother; Parkinson's disease in her paternal grandmother; Stroke (age of onset: 28) in her maternal grandmother.  SOCIAL HISTORY:  The patient  reports that she has never smoked. She has never used smokeless tobacco. She reports current alcohol use. She reports that she does not use drugs.  REVIEW OF SYSTEMS: Review of Systems  Constitutional: Negative for chills and fever.  HENT:  Negative for hoarse voice and nosebleeds.   Eyes:  Negative for discharge, double vision and pain.  Cardiovascular:  Positive for chest pain (non-cardiac, chronic and stable, for years). Negative for claudication, dyspnea on exertion, leg swelling, near-syncope, orthopnea, palpitations, paroxysmal nocturnal dyspnea and syncope.  Respiratory:  Negative for hemoptysis and shortness of breath.   Musculoskeletal:  Negative for muscle cramps and myalgias.  Gastrointestinal:  Negative for abdominal pain, constipation, diarrhea, hematemesis, hematochezia, melena, nausea and vomiting.  Neurological:  Negative for dizziness and light-headedness.    PHYSICAL EXAM:    09/02/2021    1:50 PM 09/02/2021    1:47 PM 06/16/2021    9:03 AM  Vitals with BMI  Height  5' 7"$  5' 7"$   Weight  261 lbs 256 lbs  BMI  [id number]  Systolic [id number]  Diastolic 84 84 83  Pulse 81 71 75  Physical Exam  Constitutional: No distress.  Age appropriate, hemodynamically stable.   Neck: No JVD present.  Cardiovascular: Normal rate, regular rhythm, S1 normal, S2 normal, intact distal pulses and normal pulses. Exam reveals no gallop, no S3 and no S4.  No murmur heard. Pulmonary/Chest: Effort normal and breath sounds normal. No stridor. She has  no wheezes. She has no rales.  Abdominal: Soft. Bowel sounds are normal. She exhibits no distension. There is no abdominal tenderness.  Musculoskeletal:        General: No edema.     Cervical back: Neck supple.  Neurological: She is alert and oriented to person, place, and time. She has intact cranial nerves (2-12).  Skin: Skin is warm and moist.   CARDIAC DATABASE: EKG: 06/16/2021: Normal sinus rhythm, 68 bpm, low voltage in the precordial leads, without underlying ischemia or injury pattern. ***  Echocardiogram: 07/01/2021: Left ventricle cavity is normal in size and wall thickness. Normal global wall motion. Normal LV systolic function with EF 68%. Normal diastolic filling pattern. Mild (Grade I) mitral regurgitation. Mild tricuspid regurgitation. No evidence of pulmonary hypertension.  Stress Testing: Exercise treadmill stress test 08/27/2021: Functional status: Fair.  Chest pain: No.  Reason for stopping exercise: Fatigue/weakness/leg pain.  Hypertensive response to exercise: No.  Exercise time 6 minutes 03 seconds on Bruce protocol, achieved 7.09 METS, 88% of age-predicted maximum heart rate.  Stress ECG negative for ischemia.  Low risk study.  Coronary calcium scoring 07/14/2021: Total CAC 0  Heart Catheterization: None  LABORATORY DATA:    Latest Ref Rng & Units 03/30/2021   10:50 AM 03/03/2021    8:34 PM 09/20/2016    3:00 PM  CBC  WBC 4.0 - 10.5 K/uL 14.0  10.6  6.3   Hemoglobin 12.0 - 15.0 g/dL 13.3  12.5  13.3   Hematocrit 36.0 - 46.0 % 40.3  38.9  40.9   Platelets 150 - 400 K/uL 317  286  352        Latest Ref Rng & Units 03/30/2021   10:50 AM 03/03/2021    8:34 PM 08/25/2020    7:41 AM  CMP  Glucose 70 - 99 mg/dL 119  107  103   BUN 6 - 20 mg/dL 17  13  13   $ Creatinine 0.44 - 1.00 mg/dL 1.01  0.74  0.66   Sodium 135 - 145 mmol/L 138  139  139   Potassium 3.5 - 5.1 mmol/L 3.8  3.8  4.7   Chloride 98 - 111 mmol/L 105  107  104   CO2 22 - 32 mmol/L 22   24  29   $ Calcium 8.9 - 10.3 mg/dL 9.2  9.1  9.0   Total Protein 6.5 - 8.1 g/dL 7.7   6.5   Total Bilirubin 0.3 - 1.2 mg/dL 0.8   0.4   Alkaline Phos 38 - 126 U/L 60   75   AST 15 - 41 U/L 23   16   ALT 0 - 44 U/L 22   20     Lipid Panel     Component Value Date/Time   CHOL 152 08/25/2020 0741   TRIG 57.0 08/25/2020 0741   HDL 48.80 08/25/2020 0741   CHOLHDL 3 08/25/2020 0741   VLDL 11.4 08/25/2020 0741   LDLCALC 92 08/25/2020 0741    No components found for: "NTPROBNP" No results for input(s): "PROBNP" in the last 8760 hours. No results for input(s): "TSH" in the last 8760 hours.  BMP No results for input(s): "NA", "K", "CL", "CO2", "GLUCOSE", "BUN", "CREATININE", "CALCIUM", "GFRNONAA", "GFRAA" in the last 8760 hours.   HEMOGLOBIN A1C Lab Results  Component Value Date   HGBA1C 5.7 08/25/2020    IMPRESSION:  No diagnosis found.    RECOMMENDATIONS: Jaquasia Smee is a 52 y.o. female whose past medical history and cardiac risk factors include: History of severe anxiety, PTSD, PCOS, factor V Leiden +2013, hyperlipidemia, prediabetes, family history of coronary artery disease, obesity due to excess calories, history of COVID-19 infection.  Precordial pain Noncardiac. Coronary calcium score: 0. Echocardiogram: LVEF preserved, normal diastolic function, mild MR/TR GXT: Low risk study Have asked the patient to follow-up with PCP to evaluate for noncardiac causes of chest pain.  She may try NSAIDs/Tylenol for possible costochondritis, consider mammogram if not performed, etc. Educated on the importance of improving her modifiable cardiovascular risk factors.  Mixed hyperlipidemia Continue Crestor. Currently managed by primary care provider.  Mitral regurgitation Patient is noted to have mild mitral regurgitation on recent echocardiogram.   I suspect this probably secondary to elevated blood pressures. She does not check her blood pressures at home but when  it is checked it is usually around 140 mmHg or higher. I have asked her to keep a log of her blood pressures and to review it with PCP.   Would recommend a goal systolic blood pressure around 120 mmHg unable to tolerate. Reemphasized the importance of a low-salt diet. Recommend repeating echocardiogram in 3 years to reevaluate severity of MR.  ***   FINAL MEDICATION LIST END OF ENCOUNTER: No orders of the defined types were placed in this encounter.    Current Outpatient Medications:    rosuvastatin (CRESTOR) 5 MG tablet, Take 1 tablet (5 mg total) by mouth daily. (Patient taking differently: Take 10 mg by mouth daily.), Disp: 90 tablet, Rfl: 3  No orders of the defined types were placed in this encounter.   There are no Patient Instructions on file for this visit.   --Continue cardiac medications as reconciled in final medication list. --No follow-ups on file. Or sooner if needed. --Continue follow-up with your primary care physician regarding the management of your other chronic comorbid conditions.  Patient's questions and concerns were addressed to her satisfaction. She voices understanding of the instructions provided during this encounter.   This note was created using a voice recognition software as a result there may be grammatical errors inadvertently enclosed that do not reflect the nature of this encounter. Every attempt is made to correct such errors.  Rex Kras, [place], [facility]  Pager: [phone] Office: [phone]

## 2022-10-10 ENCOUNTER — Ambulatory Visit: Payer: 59 | Admitting: Cardiology

## 2022-11-10 ENCOUNTER — Ambulatory Visit: Payer: 59 | Admitting: Cardiology

## 2022-11-14 ENCOUNTER — Ambulatory Visit: Payer: 59 | Admitting: Cardiology

## 2022-11-21 ENCOUNTER — Ambulatory Visit: Payer: 59 | Admitting: Cardiology

## 2022-11-21 ENCOUNTER — Encounter: Payer: Self-pay | Admitting: Cardiology

## 2022-11-21 VITALS — BP 128/89 | HR 80 | Resp 16 | Ht 67.0 in | Wt 264.0 lb

## 2022-11-21 DIAGNOSIS — R7303 Prediabetes: Secondary | ICD-10-CM

## 2022-11-21 DIAGNOSIS — I340 Nonrheumatic mitral (valve) insufficiency: Secondary | ICD-10-CM

## 2022-11-21 DIAGNOSIS — R072 Precordial pain: Secondary | ICD-10-CM

## 2022-11-21 DIAGNOSIS — E782 Mixed hyperlipidemia: Secondary | ICD-10-CM

## 2022-11-21 NOTE — Progress Notes (Signed)
Date:  11/21/2022   ID:  Rachel Ford, DOB [birth year], MRN [id number]  PCP:  Sueanne Margarita, DO  Cardiologist:  Rex Kras, DO, [facility] (established care 06/16/21) Former Cardiology Providers: Dr. Adrian Prows  Date: 11/21/22 Last Office Visit: 09/02/2021  Chief Complaint  Patient presents with   Chest Pain   Follow-up    1 year    HPI  Rachel Ford is a 52 y.o. female whose past medical history and cardiovascular risk factors include: History of severe anxiety, PTSD, PCOS, factor V Leiden +2013, hyperlipidemia, prediabetes, family history of coronary artery disease, obesity due to excess calories, history of COVID-19 infection.  Patient presents today for 1 year follow-up visit and reevaluation of chest pain.  In the past she is undergone echocardiogram which noted preserved LVEF and normal diastolic function, GXT was low risk, total coronary calcium score was 0.  No additional cardiovascular testing was warranted in the interim.  Clinically since last office visit she continues to have nonspecific precordial discomfort, localized over the left breast, no change in intensity/frequency/duration, nonexertional, and does not get better with resting.  Symptoms are more noticeable when she lays down which also be secondary to her chronic back pain " as I have spinal issues at baseline."  Her 64-monthblood pressure averages 130/88 mmHg.  She is currently on amlodipine.  She has been given a prescription for olmesartan but has not started it.   ALLERGIES: Allergies  Allergen Reactions   Codeine Diarrhea and Nausea And Vomiting    MEDICATION LIST PRIOR TO VISIT: Current Meds  Medication Sig   amLODipine (NORVASC) 5 MG tablet Take 5 mg by mouth daily.   rosuvastatin (CRESTOR) 5 MG tablet Take 1 tablet (5 mg total) by mouth daily. (Patient taking differently: Take 10 mg by mouth daily.)     PAST MEDICAL HISTORY: Past Medical History:  Diagnosis Date    Depression    post partum   [place]   Hyperlipidemia    Lumbar herniated disc    Menorrhagia    OCD (obsessive compulsive disorder)    PCOS (polycystic ovarian syndrome)     PAST SURGICAL HISTORY: Past Surgical History:  Procedure Laterality Date   CESAREAN SECTION     CHOLECYSTECTOMY     herniated disc surgery     WISDOM TOOTH EXTRACTION      FAMILY HISTORY: The patient family history includes Alzheimer's disease in her paternal grandfather; Cancer in an other family member; Depression in her sister; Diabetes (age of onset: 742 in her paternal grandmother; Early death in her maternal grandfather; Heart attack in her maternal grandfather; Hyperlipidemia in her brother and sister; Hypertension in her father; Melanoma in her mother; Parkinson's disease in her paternal grandmother; Stroke (age of onset: 776 in her maternal grandmother.  SOCIAL HISTORY:  The patient  reports that she has never smoked. She has never used smokeless tobacco. She reports current alcohol use. She reports that she does not use drugs.  REVIEW OF SYSTEMS: Review of Systems  Constitutional: Negative for chills and fever.  HENT:  Negative for hoarse voice and nosebleeds.   Eyes:  Negative for discharge, double vision and pain.  Cardiovascular:  Positive for chest pain (non-cardiac, chronic and stable, for years). Negative for claudication, dyspnea on exertion, leg swelling, near-syncope, orthopnea, palpitations, paroxysmal nocturnal dyspnea and syncope.  Respiratory:  Negative for hemoptysis and shortness of breath.   Musculoskeletal:  Negative for muscle cramps and myalgias.  Gastrointestinal:  Negative for abdominal pain, constipation, diarrhea, hematemesis, hematochezia, melena, nausea and vomiting.  Neurological:  Negative for dizziness and light-headedness.    PHYSICAL EXAM:    11/21/2022   11:25 AM 11/21/2022   11:17 AM 09/02/2021    1:50 PM  Vitals with  BMI  Height  '5\' 7"'$    Weight  264 lbs   BMI  [id number]   Systolic 0000000 123456 123456  Diastolic 89 90 84  Pulse 80 76 81   Physical Exam  Constitutional: No distress.  Age appropriate, hemodynamically stable.   Neck: No JVD present.  Cardiovascular: Normal rate, regular rhythm, S1 normal, S2 normal, intact distal pulses and normal pulses. Exam reveals no gallop, no S3 and no S4.  No murmur heard. Pulmonary/Chest: Effort normal and breath sounds normal. No stridor. She has no wheezes. She has no rales.  Abdominal: Soft. Bowel sounds are normal. She exhibits no distension. There is no abdominal tenderness.  Musculoskeletal:        General: No edema.     Cervical back: Neck supple.  Neurological: She is alert and oriented to person, place, and time. She has intact cranial nerves (2-12).  Skin: Skin is warm and moist.   CARDIAC DATABASE: EKG: 11/21/2022: Sinus rhythm, 78 bpm, nonspecific T wave abnormality.  No significant change compared to 06/16/2021.  Echocardiogram: 07/01/2021: Left ventricle cavity is normal in size and wall thickness. Normal global wall motion. Normal LV systolic function with EF 68%. Normal diastolic filling pattern. Mild (Grade I) mitral regurgitation. Mild tricuspid regurgitation. No evidence of pulmonary hypertension.  Stress Testing: Exercise treadmill stress test 08/27/2021: Functional status: Fair.  Chest pain: No.  Reason for stopping exercise: Fatigue/weakness/leg pain.  Hypertensive response to exercise: No.  Exercise time 6 minutes 03 seconds on Bruce protocol, achieved 7.09 METS, 88% of age-predicted maximum heart rate.  Stress ECG negative for ischemia.  Low risk study.  Coronary calcium scoring 07/14/2021: Total CAC 0  Heart Catheterization: None  LABORATORY DATA:    Latest Ref Rng & Units 03/30/2021   10:50 AM 03/03/2021    8:34 PM 09/20/2016    3:00 PM  CBC  WBC 4.0 - 10.5 K/uL 14.0  10.6  6.3   Hemoglobin 12.0 - 15.0 g/dL 13.3  12.5  13.3    Hematocrit 36.0 - 46.0 % 40.3  38.9  40.9   Platelets 150 - 400 K/uL 317  286  352        Latest Ref Rng & Units 03/30/2021   10:50 AM 03/03/2021    8:34 PM 08/25/2020    7:41 AM  CMP  Glucose 70 - 99 mg/dL 119  107  103   BUN 6 - 20 mg/dL '17  13  13   '$ Creatinine 0.44 - 1.00 mg/dL 1.01  0.74  0.66   Sodium 135 - 145 mmol/L 138  139  139   Potassium 3.5 - 5.1 mmol/L 3.8  3.8  4.7   Chloride 98 - 111 mmol/L 105  107  104   CO2 22 - 32 mmol/L '22  24  29   '$ Calcium 8.9 - 10.3 mg/dL 9.2  9.1  9.0   Total Protein 6.5 - 8.1 g/dL 7.7   6.5   Total Bilirubin 0.3 - 1.2 mg/dL 0.8   0.4   Alkaline Phos 38 - 126 U/L 60   75   AST 15 - 41 U/L 23   16   ALT 0 - 44 U/L 22  20     Lipid Panel     Component Value Date/Time   CHOL 152 08/25/2020 0741   TRIG 57.0 08/25/2020 0741   HDL 48.80 08/25/2020 0741   CHOLHDL 3 08/25/2020 0741   VLDL 11.4 08/25/2020 0741   LDLCALC 92 08/25/2020 0741    No components found for: "NTPROBNP" No results for input(s): "PROBNP" in the last 8760 hours. No results for input(s): "TSH" in the last 8760 hours.   BMP No results for input(s): "NA", "K", "CL", "CO2", "GLUCOSE", "BUN", "CREATININE", "CALCIUM", "GFRNONAA", "GFRAA" in the last 8760 hours.   HEMOGLOBIN A1C Lab Results  Component Value Date   HGBA1C 5.7 08/25/2020    IMPRESSION:    ICD-10-CM   1. Precordial pain  R07.2 EKG 12-Lead    2. Mixed hyperlipidemia  E78.2     3. Nonrheumatic mitral valve regurgitation  I34.0 PCV ECHOCARDIOGRAM COMPLETE    4. Prediabetes  R73.03     5. Class 3 severe obesity due to excess calories with serious comorbidity and body mass index (BMI) of 40.0 to 44.9 in adult [facility])  E66.01    Z68.41        RECOMMENDATIONS: Sonna Vandergriff is a 52 y.o. female whose past medical history and cardiac risk factors include: History of severe anxiety, PTSD, PCOS, factor V Leiden +2013, hyperlipidemia, prediabetes, family history of coronary artery disease,  obesity due to excess calories, history of COVID-19 infection.  Precordial pain Noncardiac. Coronary calcium score: 0. Echocardiogram: LVEF preserved, normal diastolic function, mild MR/TR GXT: Low risk study No change in intensity, frequency, and duration since last office visit a year ago. More pronounced when she lays flat (she has baseline back/spinal discomfort) and localizable over the left anterior chest wall. Will repeat an echocardiogram to evaluate for LVEF, regional wall motion normalities, and valvular heart disease. As long as the echocardiogram is within acceptable limits no additional workup is warranted otherwise we will consider additional testing. Reemphasized the importance of improving her modifiable cardiovascular risk factors.  Mixed hyperlipidemia Continue Crestor. Currently managed by primary care provider. Discussed with the patient that if her upcoming lipids are well-controlled could consider reducing Crestor to 5 mg p.o. nightly and to reevaluate her lipids in 6 weeks.  If the repeat lipids are well-controlled could consider lower dose of statin therapy given her young age; however, if the lipids are not well-controlled reinitiating Crestor 10 mg p.o. nightly is acceptable as well.  She will discuss it further with PCP  Mitral regurgitation Prior echocardiogram noted mild MR. At the last office visit recommended her to be more cognizant of her blood pressures. Currently on amlodipine and her office blood pressures are well-controlled. Will hold off on initiating olmesartan given the fact that her blood pressures are within acceptable limits. If her SBP is consistently greater than 130 mmHg then the addition of olmesartan is very acceptable.   Will reevaluate the severity of MR as discussed above with the upcoming echo.  Class 3 severe obesity due to excess calories with serious comorbidity and body mass index (BMI) of 40.0 to 44.9 in adult [facility]) Body mass index  is 41.35 kg/m. I reviewed with her importance of diet, regular physical activity/exercise, weight loss.  She plans to consider weight loss clinic consultation at [facility].  She will discuss it further with PCP Patient is educated on the importance of increasing physical activity gradually as tolerated with a goal of moderate intensity exercise for 30 minutes a day 5  days a week.  FINAL MEDICATION LIST END OF ENCOUNTER: No orders of the defined types were placed in this encounter.    Current Outpatient Medications:    amLODipine (NORVASC) 5 MG tablet, Take 5 mg by mouth daily., Disp: , Rfl:    rosuvastatin (CRESTOR) 5 MG tablet, Take 1 tablet (5 mg total) by mouth daily. (Patient taking differently: Take 10 mg by mouth daily.), Disp: 90 tablet, Rfl: 3   olmesartan (BENICAR) 5 MG tablet, Take 5 mg by mouth daily. (Patient not taking: Reported on 11/21/2022), Disp: , Rfl:   Orders Placed This Encounter  Procedures   EKG 12-Lead   PCV ECHOCARDIOGRAM COMPLETE     There are no Patient Instructions on file for this visit.   --Continue cardiac medications as reconciled in final medication list. --Return in about 1 year (around 11/21/2023) for Follow up MR . Or sooner if needed. --Continue follow-up with your primary care physician regarding the management of your other chronic comorbid conditions.  Patient's questions and concerns were addressed to her satisfaction. She voices understanding of the instructions provided during this encounter.   This note was created using a voice recognition software as a result there may be grammatical errors inadvertently enclosed that do not reflect the nature of this encounter. Every attempt is made to correct such errors.  Rex Kras, [place], [facility]  Pager: [phone] Office: [phone]

## 2023-04-20 ENCOUNTER — Other Ambulatory Visit: Payer: Self-pay | Admitting: Obstetrics and Gynecology

## 2023-04-20 DIAGNOSIS — R928 Other abnormal and inconclusive findings on diagnostic imaging of breast: Secondary | ICD-10-CM

## 2023-04-28 ENCOUNTER — Other Ambulatory Visit: Payer: No Typology Code available for payment source

## 2023-05-01 ENCOUNTER — Ambulatory Visit: Payer: No Typology Code available for payment source

## 2023-05-01 ENCOUNTER — Ambulatory Visit
Admission: RE | Admit: 2023-05-01 | Discharge: 2023-05-01 | Disposition: A | Discharge status: Home / Self Care | Payer: 59 | Source: Ambulatory Visit | Attending: Obstetrics and Gynecology | Admitting: Obstetrics and Gynecology

## 2023-05-01 DIAGNOSIS — R928 Other abnormal and inconclusive findings on diagnostic imaging of breast: Secondary | ICD-10-CM

## 2023-08-08 NOTE — Progress Notes (Signed)
 No chief complaint on file.   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  She has had problems with gallbladder for sometime and saw Dr. Gretta, and had a laproscopic cholecystectomy on 11/04/2016. She had an ultrasound that revealed a 2.6 x 1.6 cm right posterior interpolar cyst.  There is a possible mural nodule possibly some old hemorrhage. Her creatinine was 0.70 on 09/20/16.  She has had some intermittent right flank pain but that resolved with the gallbladder surgery. She does have factor V Leiden and she urinates well. She underwent a renal ultrasound on 04/10/17 that revealed a complex small cyst of the right kidney which appeared to be unchanged on 04/09/18.    MR Kidneys WO Contrast 09/30/19:   Minimally complex cyst arising from the posterior aspect of the interpolar region of the right kidney, statistically most likely benign lesion. No suspicious renal masses on this noncontrast MRI of the abdomen.   RUS 08/04/22:  Nonobstructing right nephrolithiasis without evidence of hydronephrosis.  Similar appearance of the right simple cyst.  Renal ultrasound:  Looks stable but read is pending  Past Medical History:  Diagnosis Date  . Cholelithiasis 09/29/2016  . Elevated liver enzymes 09/29/2016  . Gestational diabetes   . Irritable bowel syndrome     Allergies  Allergen Reactions  . Codeine Diarrhea and GI Intolerance     Current Outpatient Medications:  .  amLODIPine (NORVASC) 5 mg tablet, Take 5 mg by mouth., Disp: , Rfl:  .  gabapentin (NEURONTIN) 300 mg capsule, TAKE 1 CAP NIGHTLY FOR 3DAYS, THEN 1 CAP IN THE MORNING AND NIGHT FOR 3DAYS, THEN 1 CAP EVERY 8HRS (Patient not taking: Reported on 08/10/2023), Disp: , Rfl: 1 .  hydrocortisone 2.5 % ointment, Apply  topically 2 (two) times a day as needed. (Patient not taking: Reported on 08/10/2023), Disp: , Rfl:  .  ibuprofen (MOTRIN) 600 mg tablet, TAKE 1 TABLET 3 TIMES A DAY AS NEEDED FOR PAIN  (Patient not taking: Reported on 08/10/2023), Disp: , Rfl: 1 .  nystatin (MYCOSTATIN) 500,000 unit tab tablet, TAKE 1 TABLET BY MOUTH TWICE A DAY (Patient not taking: Reported on 08/10/2023), Disp: , Rfl: 1 .  pregabalin (Lyrica) 25 mg capsule, Take 25 mg by mouth 3 (three) times a day. (Patient not taking: Reported on 08/10/2023), Disp: 90 capsule, Rfl: 1 .  progesterone (PROMETRIUM) 100 mg cap capsule, TAKE ONE CAPSULE EVERY NIGHT (Patient not taking: Reported on 08/10/2023), Disp: , Rfl: 3 .  rosuvastatin  (CRESTOR ) 10 mg tablet, , Disp: , Rfl:  .  tamsulosin  (FLOMAX ) 0.4 mg cap, TAKE 1 CAPSULE DAILY AS NEEDED FOR STONE SYMPTOMS (Patient not taking: Reported on 08/10/2023), Disp: 90 capsule, Rfl: 1 .  zolpidem  (AMBIEN ) 5 mg tablet, , Disp: , Rfl:   Family History  Problem Relation Name Age of Onset  . Melanoma Mother    . Hypertension Father    . Diabetes Paternal Aunt    . Diabetes Paternal Uncle    . Stroke Maternal Grandmother    . Heart attack Maternal Grandfather    . Osteoporosis Paternal Grandmother      Social History   Socioeconomic History  . Marital status: Married    Spouse name: None  . Number of children: None  . Years of education: None  . Highest education level: None  Occupational History  . None  Tobacco Use  . Smoking status: Never  . Smokeless tobacco: Never  Vaping  Use  . Vaping status: Never Used  Substance and Sexual Activity  . Alcohol use: Yes    Comment: occ  . Drug use: No  . Sexual activity: None  Other Topics Concern  . None  Social History Narrative  . None   Social Determinants of Health   Food Insecurity: Low Risk  (08/10/2023)   Food vital sign   . Within the past 12 months, you worried that your food would run out before you got money to buy more: Never true   . Within the past 12 months, the food you bought just didn't last and you didn't have money to get more: Never true  Transportation Needs: No Transportation Needs  (08/10/2023)   Transportation   . In the past 12 months, has lack of reliable transportation kept you from medical appointments, meetings, work or from getting things needed for daily living? : No  Safety: Low Risk  (08/10/2023)   Safety   . How often does anyone, including family and friends, physically hurt you?: Never   . How often does anyone, including family and friends, insult or talk down to you?: Never   . How often does anyone, including family and friends, threaten you with harm?: Never   . How often does anyone, including family and friends, scream or curse at you?: Never  Living Situation: Low Risk  (08/10/2023)   Living Situation   . What is your living situation today?: I have a steady place to live   . Think about the place you live. Do you have problems with any of the following? Choose all that apply:: None/None on this list     Patient Active Problem List   Diagnosis Date Noted  Date Diagnosed  . Choledocholithiasis 10/17/2016   . Recurrent nephrolithiasis 08/08/2023   . Morbid obesity (HCC) 05/04/2021   . Insomnia 01/15/2021     Overview Note:    Last Assessment & Plan:  Formatting of this note might be different from the original. Habitual waking around 2:00AM. Pattern developed with hormone changes of  childbirth, but persistent. We discussed basic sleep hygiene. This may be aggravated by her restlessness in bed she attributes to nerve  and back pain.  Plan- initial trial of short half-life sonata  taken if she wakes during  night.    . Snoring 01/15/2021     Overview Note:    Last Assessment & Plan:  Formatting of this note might be different from the original. Snoring, daytime tiredness and exam suggest OSA. Appropriate discussion,  including responsibility to drive safely Plan- sleep study then possibly CPAP    . Hyperlipidemia 08/28/2020   . Left leg pain 08/31/2017   . Numbness and tingling of left leg 08/31/2017   . Flank pain 04/10/2017   .  Lumbar herniated disc 10/11/2016   . PCOS (polycystic ovarian syndrome) 10/11/2016   . Renal cyst 10/10/2016     Overview Note:    Added automatically from request for surgery [id number]    . Calculus of gallbladder with cholecystitis with biliary obstruction 10/10/2016     Overview Note:    Added automatically from request for surgery [id number]    . Cholelithiasis 09/29/2016   . Elevated liver enzymes 09/29/2016   . Factor 5 Leiden mutation, heterozygous (HCC) 09/20/2012     Resolved Problems  No resolved problems to display.    ROS: A comprehensive review of systems was negative.  EXAM:  Vitals:   08/10/23 0832  BP:  117/71  Pulse: 76  Temp: 96.9 F (36.1 C)  SpO2: 98%   GENERAL: vitals reviewed and listed below, alert, oriented, appears well hydrated and in no acute distress HEENT: WNL Neck: Norm ROM LUNGS: clear to auscultation bilaterally, no wheezes, rales or rhonchi, good air movement CV: HRRR, no peripheral edema ABDOMEN: Soft without masses or organomegaly FLANKS: No CVA tenderness noted to palpation. EXTREMITIES: warm and well perfused Neuro:  Intact Skin:  No significant lesions noted PSYCH: pleasant and cooperative, no obvious depression or anxiety Skin:  Without lesions.  Results for orders placed or performed in visit on 10/25/16  HX Surgical Pathology Report   Collection Time: 10/25/16  2:08 PM  Result Value Ref Range   Final Diagnosis      MICROSCOPIC EXAMINATION AND DIAGNOSIS  GALLBLADDER, CHOLECYSTECTOMY:      Chronic cholecystitis.      Cholelithiasis.     Clinical History/Information      Elevated liver enzymes. Kidney cysts. Calculus the gallbladder with cholecystitis biliary obstruction.    Gross Description      Received labeled gallbladder is a 7.5 x 2.6 cm gallbladder. The serosa is tan-pink and smooth. The hepatic surface is brown and granular (inked blue). The lumen contains multiple calculi measuring up to 0.2 cm in greatest  dimensions. The mucosa is red-brown and velvety. The wall is 0.2 cm thick. The cystic duct lymph node is not identified. Sections of the cystic duct margin and gallbladder wall are submitted in A1.    Converted Historic Report        ACCESSION NUMBER:  [id number] RECEIVED: 10/25/2016 ORDERING PHYSICIAN:  CHIP ETHAN GASKINS , MD PATIENT NAME:  Moch, Aniqa KOVACEVICH SURGICAL PATHOLOGY REPORT  FINAL PATHOLOGIC DIAGNOSIS MICROSCOPIC EXAMINATION AND DIAGNOSIS  GALLBLADDER, CHOLECYSTECTOMY:      Chronic cholecystitis.      Cholelithiasis.    I have personally reviewed the slides and/or other related materials referenced, and have edited the report as part of my pathologic assessment and final interpretation.  Electronically Signed Out By:   ARETTA ERIKA SENTER   11/03/2016 18:27:08  aak/aak  Specimen(s) Received  Gallbladder   Clinical History Elevated liver enzymes. Kidney cysts. Calculus the gallbladder with cholecystitis biliary obstruction.       Gross Description Received labeled gallbladder is a 7.5 x 2.6 cm gallbladder. The serosa is tan-pink and smooth. The hepatic surface is brown and granular (inked blue). The lumen contains multiple calculi measuring up to 0.2 cm in  greatest dimensions. The mucosa is red-brown and velvety. The wall is 0.2 cm thick. The cystic duct lymph node is not identified. Sections of the cystic duct margin and gallbladder wall are submitted in A1.  SAMI AUGUSTINE BELAK       Resulting Agency: [facility] CLIA# [id number] [facility] [place]  [place]  [address]       Assessment:   Patient Active Problem List  Diagnosis  . Cholelithiasis  . Elevated liver enzymes  . Renal cyst  . Calculus of gallbladder with cholecystitis with biliary obstruction  . Factor 5 Leiden mutation, heterozygous (HCC)  . Lumbar herniated disc  . PCOS (polycystic ovarian syndrome)  .  Choledocholithiasis  . Flank pain  . Left leg pain  . Numbness and tingling of left leg  . Hyperlipidemia  . Morbid obesity (HCC)  . Insomnia  . Snoring  . Recurrent nephrolithiasis    Plan:  Diagnoses and all orders for this visit:  Renal cyst -     US  Renal Bilateral Complete; Future  Factor 5 Leiden mutation, heterozygous (HCC)  Recurrent nephrolithiasis -     US  Renal Bilateral Complete; Future    Risk Assessment:  Overall Rachel Ford is doing well.  She passed a stone which is great.  Will Sunar year for renal ultrasound and office visit and we will get the radiologic overread to her.              Return in about 1 year (around 08/09/2024) for RUS and OV.    Electronically signed by: Tanda LITTIE Nicholaus DOUGLAS, MD 08/10/2023 9:08 AM

## 2023-11-14 ENCOUNTER — Ambulatory Visit (HOSPITAL_COMMUNITY): Payer: 59 | Attending: Cardiology

## 2023-11-14 DIAGNOSIS — I340 Nonrheumatic mitral (valve) insufficiency: Secondary | ICD-10-CM | POA: Diagnosis present

## 2023-11-14 LAB — ECHOCARDIOGRAM COMPLETE
Area-P 1/2: 3.21 cm2
S' Lateral: 2.6 cm

## 2023-11-19 ENCOUNTER — Encounter: Payer: Self-pay | Admitting: Cardiology

## 2023-11-23 ENCOUNTER — Encounter: Payer: Self-pay | Admitting: Cardiology

## 2023-11-23 ENCOUNTER — Ambulatory Visit: Payer: 59 | Attending: Cardiology | Admitting: Cardiology

## 2023-11-23 VITALS — BP 122/78 | HR 85 | Resp 16 | Ht 67.0 in | Wt 258.0 lb

## 2023-11-23 DIAGNOSIS — R7303 Prediabetes: Secondary | ICD-10-CM

## 2023-11-23 DIAGNOSIS — R072 Precordial pain: Secondary | ICD-10-CM | POA: Diagnosis not present

## 2023-11-23 DIAGNOSIS — I340 Nonrheumatic mitral (valve) insufficiency: Secondary | ICD-10-CM

## 2023-11-23 DIAGNOSIS — E66813 Obesity, class 3: Secondary | ICD-10-CM

## 2023-11-23 DIAGNOSIS — Z6841 Body Mass Index (BMI) 40.0 and over, adult: Secondary | ICD-10-CM

## 2023-11-23 DIAGNOSIS — E782 Mixed hyperlipidemia: Secondary | ICD-10-CM

## 2023-11-23 MED ORDER — METOPROLOL TARTRATE 25 MG PO TABS: 25.0000 mg | ORAL_TABLET | Freq: Once | ORAL | 0 refills | Status: AC

## 2023-11-23 NOTE — Progress Notes (Signed)
 Cardiology Office Note:  .   Date:  11/25/2023  ID:  Rachel Ford, DOB 02-17-1971, MRN [id number] PCP:  Charlane Ferretti, DO  Former Cardiology Providers: Dr. Yates Decamp  [facility] Providers Cardiologist:  Tessa Lerner, DO , [facility] (established care 06/16/21) Electrophysiologist:  None  Click to update primary MD,subspecialty MD or APP then REFRESH:1}    Chief Complaint  Patient presents with   Precordial pain   Follow-up    1 year    History of Present Illness: .   Rachel Ford is a 53 y.o. Caucasian female whose past medical history and cardiovascular risk factors includes: History of severe anxiety, PTSD, PCOS, factor V Leiden +2013, hyperlipidemia, prediabetes, family history of coronary artery disease, obesity due to excess calories, history of COVID-19 infection.   Patient was referred to the practice for evaluation of precordial pain.  She underwent appropriate cardiovascular testing as outlined below.  In summary her LVEF was normal, and normal diastolic function, GXT was low risk, and total coronary calcium score was 0.  She presents today for 1 year follow-up visit.  Chest pain: Still present. Left-sided. Occurs several times a day. Intermittent, random occurrences. More noticeable with sitting still. Self-limited. Not brought on by effort related activities. Duration less than 1 minute. Overall characteristics is similar to last year but more frequent.  Patient states that she lost her dog earlier this week and has been starting to focus more on herself.  She started walking at least 2 miles per day.   Review of Systems: .   Review of Systems  Constitutional: Positive for weight loss (6#).  Cardiovascular:  Positive for chest pain (see HPI). Negative for claudication, irregular heartbeat, leg swelling, near-syncope, orthopnea, palpitations, paroxysmal nocturnal dyspnea and syncope.  Respiratory:  Negative for shortness of breath.    Hematologic/Lymphatic: Negative for bleeding problem.    Studies Reviewed:   EKG: EKG Interpretation Date/Time:  Thursday November 23 2023 11:46:02 EST Ventricular Rate:  79 PR Interval:  176 QRS Duration:  86 QT Interval:  390 QTC Calculation: 447 R Axis:   -11  Text Interpretation: Normal sinus rhythm Possible Anterolateral infarct (cited on or before 03-Mar-2021) When compared with ECG of 03-Mar-2021 20:18, No significant change since last tracing Confirmed by Tessa Lerner [phone]) on 11/23/2023 11:54:08 AM  Echocardiogram: 11/14/2023 1. Left ventricular ejection fraction by 3D volume is 62 %. The left ventricle has normal function. The left ventricle has no regional wall motion abnormalities. Left ventricular diastolic parameters were normal. The average left ventricular global  longitudinal strain is -21.9 %. The global longitudinal strain is normal. 2. Right ventricular systolic function is normal. The right ventricular size is normal. 3. The mitral valve is normal in structure. No evidence of mitral valve regurgitation. No evidence of mitral stenosis. 4. The aortic valve is normal in structure. Aortic valve regurgitation is not visualized. No aortic stenosis is present. 5. The inferior vena cava is normal in size with greater than 50% respiratory variability, suggesting right atrial pressure of 3 mmHg.    Stress Testing: Exercise treadmill stress test 08/27/2021: Low risk study.     Coronary calcium scoring 07/14/2021: Total CAC 0  RADIOLOGY: NA  Risk Assessment/Calculations:   NA   Labs:       Latest Ref Rng & Units 03/30/2021   10:50 AM 03/03/2021    8:34 PM 09/20/2016    3:00 PM  CBC  WBC 4.0 - 10.5 K/uL 14.0  10.6  6.3  Hemoglobin 12.0 - 15.0 g/dL 69.6  29.5  28.4   Hematocrit 36.0 - 46.0 % 40.3  38.9  40.9   Platelets 150 - 400 K/uL 317  286  352        Latest Ref Rng & Units 03/30/2021   10:50 AM 03/03/2021    8:34 PM 08/25/2020    7:41 AM  BMP  Glucose 70  - 99 mg/dL 132  440  102   BUN 6 - 20 mg/dL 17  13  13    Creatinine 0.44 - 1.00 mg/dL 7.25  3.66  4.40   Sodium 135 - 145 mmol/L 138  139  139   Potassium 3.5 - 5.1 mmol/L 3.8  3.8  4.7   Chloride 98 - 111 mmol/L 105  107  104   CO2 22 - 32 mmol/L 22  24  29    Calcium 8.9 - 10.3 mg/dL 9.2  9.1  9.0       Latest Ref Rng & Units 03/30/2021   10:50 AM 03/03/2021    8:34 PM 08/25/2020    7:41 AM  CMP  Glucose 70 - 99 mg/dL 347  425  956   BUN 6 - 20 mg/dL 17  13  13    Creatinine 0.44 - 1.00 mg/dL 3.87  5.64  3.32   Sodium 135 - 145 mmol/L 138  139  139   Potassium 3.5 - 5.1 mmol/L 3.8  3.8  4.7   Chloride 98 - 111 mmol/L 105  107  104   CO2 22 - 32 mmol/L 22  24  29    Calcium 8.9 - 10.3 mg/dL 9.2  9.1  9.0   Total Protein 6.5 - 8.1 g/dL 7.7   6.5   Total Bilirubin 0.3 - 1.2 mg/dL 0.8   0.4   Alkaline Phos 38 - 126 U/L 60   75   AST 15 - 41 U/L 23   16   ALT 0 - 44 U/L 22   20     Lab Results  Component Value Date   CHOL 152 08/25/2020   HDL 48.80 08/25/2020   LDLCALC 92 08/25/2020   TRIG 57.0 08/25/2020   CHOLHDL 3 08/25/2020   No results for input(s): "LIPOA" in the last 8760 hours. No components found for: "NTPROBNP" No results for input(s): "PROBNP" in the last 8760 hours. No results for input(s): "TSH" in the last 8760 hours.  Physical Exam:    Today's Vitals   11/23/23 1141  BP: 122/78  Pulse: 85  Resp: 16  SpO2: 97%  Weight: 258 lb (117 kg)  Height: 5\' 7"  (1.702 m)   Body mass index is 40.41 kg/m. Wt Readings from Last 3 Encounters:  11/23/23 258 lb (117 kg)  11/21/22 264 lb (119.7 kg)  09/02/21 261 lb (118.4 kg)    Physical Exam  Constitutional: No distress.  Age appropriate, hemodynamically stable.   Neck: No JVD present.  Cardiovascular: Normal rate, regular rhythm, S1 normal, S2 normal, intact distal pulses and normal pulses. Exam reveals no gallop, no S3 and no S4.  No murmur heard. Pulmonary/Chest: Effort normal and breath sounds normal. No  stridor. She has no wheezes. She has no rales.  Abdominal: Soft. Bowel sounds are normal. She exhibits no distension. There is no abdominal tenderness.  Musculoskeletal:        General: No edema.     Cervical back: Neck supple.  Neurological: She is alert and oriented to person, place, and time. She  has intact cranial nerves (2-12).  Skin: Skin is warm and moist.   Impression & Recommendation(s):  Impression:   ICD-10-CM   1. Precordial pain  R07.2 EKG 12-Lead    Basic metabolic panel    Basic metabolic panel    CT CORONARY MORPH W/CTA COR W/SCORE W/CA W/CM &/OR WO/CM    metoprolol tartrate (LOPRESSOR) 25 MG tablet    2. Mixed hyperlipidemia  E78.2     3. Nonrheumatic mitral valve regurgitation  I34.0     4. Prediabetes  R73.03     5. Class 3 severe obesity due to excess calories with serious comorbidity and body mass index (BMI) of 40.0 to 44.9 in adult [facility])  Y78.295    E66.01    Z68.41        Recommendation(s):  Precordial pain Based on symptoms predominate noncardiac. However, symptoms are more frequent since last workup. Reassurance provided. EKG is nonischemic However, shared decision was if symptoms continue she will proceed forward with coronary CTA which will help evaluate for CAC, plaque volume, and obstructive disease. If he proceeds forward with coronary CTA she will need to start Lopressor a week prior to the procedure and have a BMP checked. Till then conservative management. Educated her on seeking medical attention sooner by going to the closest ER via EMS if the symptoms increase in intensity, frequency, duration, or has typical chest pain as discussed in the office.  Patient verbalized understanding.  Mixed hyperlipidemia In the past was on Crestor but the medication is stopped secondary to concerns for blood sugar. Shared decision was to continue with lifestyle modifications, exercise, and improving her modifiable cardiovascular risk factors. We also  discussed red yeast rice as a supplement. She will have lipids rechecked in May 2025 with PCP.  Nonrheumatic mitral valve regurgitation Per echocardiogram from 2022 noted mild mitral regurgitation. Clinically she has remained asymptomatic. Underwent a 3-year follow-up study in February 2025 which notes normal mitral valve structure and no evidence of mitral regurgitation. No additional workup warranted at this time.  Class 3 severe obesity due to excess calories with serious comorbidity and body mass index (BMI) of 40.0 to 44.9 in adult [facility]) Body mass index is 40.41 kg/m. I reviewed with her importance of diet, regular physical activity/exercise, weight loss.   Patient is educated on the importance of increasing physical activity gradually as tolerated with a goal of moderate intensity exercise for 30 minutes a day 5 days a week.  Orders Placed:  Orders Placed This Encounter  Procedures   CT CORONARY MORPH W/CTA COR W/SCORE W/CA W/CM &/OR WO/CM    Standing Status:   Future    Expiration Date:   11/22/2024    If indicated for the ordered procedure, I authorize the administration of contrast media per Radiology protocol:   Yes    Initiate Coronary CTA Adult Protocol:   Yes    If indicated initiate Post Coronary CTA Hypotension Adult Protocol:   Yes    Does the patient have a contrast media/X-ray dye allergy?:   No    Is patient pregnant?:   No    Preferred Imaging Location?:   [facility]    Authorization::   FFR will be ordered if deemed medically necessary   Basic metabolic panel    Standing Status:   Future    Number of Occurrences:   1    Expected Date:   11/23/2023    Expiration Date:   11/22/2024   EKG 12-Lead  Final Medication List:    Meds ordered this encounter  Medications   metoprolol tartrate (LOPRESSOR) 25 MG tablet    Sig: Take 1 tablet (25 mg total) by mouth once for 1 dose. Take 90-120 minutes prior to scan. Hold for systolic blood pressure (top number)  less than 100 and/ or heart rate less than 55. STOP taking once your scan is complete.    Dispense:  14 tablet    Refill:  0    Medications Discontinued During This Encounter  Medication Reason   olmesartan (BENICAR) 5 MG tablet Discontinued by provider   rosuvastatin (CRESTOR) 5 MG tablet Discontinued by provider     Current Outpatient Medications:    amLODipine (NORVASC) 5 MG tablet, Take 5 mg by mouth daily., Disp: , Rfl:    ketorolac (TORADOL) 10 MG tablet, Take 1 tablet by mouth every 8 (eight) hours as needed., Disp: , Rfl:    metoprolol tartrate (LOPRESSOR) 25 MG tablet, Take 1 tablet (25 mg total) by mouth once for 1 dose. Take 90-120 minutes prior to scan. Hold for systolic blood pressure (top number) less than 100 and/ or heart rate less than 55. STOP taking once your scan is complete., Disp: 14 tablet, Rfl: 0   tamsulosin (FLOMAX) 0.4 MG CAPS capsule, Take 0.4 mg by mouth daily as needed., Disp: , Rfl:     Disposition:   Return in about 1 year (around 11/22/2024) for Follow up.   Advised the patient to schedule her annual follow-up approximately a month after her yearly annual visit with PCP.  This way the most recent labs are available for review/reference.   Her questions and concerns were addressed to her satisfaction. She voices understanding of the recommendations provided during this encounter.    Signed, Tessa Lerner, DO, [facility]  [facility]  [address] [place], [place] [address] 11/25/2023 10:17 PM

## 2023-11-23 NOTE — Patient Instructions (Addendum)
 Medication Instructions:  Your physician has recommended you make the following change in your medication:   START Metoprolol Tartrate (Lopressor) 25 mg twice daily starting 1 week prior to coronary CTA. *Hold for systolic blood pressure (top number) less than 100 and/ or heart rate less than 55. STOP taking once your scan is complete.   *If you need a refill on your cardiac medications before your next appointment, please call your pharmacy*  Lab Work: To be completed today: BMP  If you have labs (blood work) drawn today and your tests are completely normal, you will receive your results only by: MyChart Message (if you have MyChart) OR A paper copy in the mail If you have any lab test that is abnormal or we need to change your treatment, we will call you to review the results.  Testing/Procedures: None ordered today.  Follow-Up: At [facility], you and your health needs are our priority.  As part of our continuing mission to provide you with exceptional heart care, we have created designated Provider Care Teams.  These Care Teams include your primary Cardiologist (physician) and Advanced Practice Providers (APPs -  Physician Assistants and Nurse Practitioners) who all work together to provide you with the care you need, when you need it.    Your next appointment:   1 year(s)  The format for your next appointment:   In Person  Provider:   Tessa Lerner, DO {  Other Instructions   Your cardiac CT will be scheduled at one of the below locations:   [facility] [address] [place], [place] [address] [phone]   If scheduled at [facility], please arrive at the [facility] and Children[facility] Entrance (Entrance C2) of [facility] 30 minutes prior to test start time. You can use the FREE valet parking offered at entrance C (encouraged to control the heart rate for the test)  Proceed to the [facility] Radiology Department (first floor) to check-in  and test prep.  All radiology patients and guests should use entrance C2 at [facility], accessed from [facility], even though the hospital's physical address listed is [address].    Please follow these instructions carefully (unless otherwise directed):  An IV will be required for this test and Nitroglycerin will be given.   On the Night Before the Test: Be sure to Drink plenty of water. Do not consume any caffeinated/decaffeinated beverages or chocolate 12 hours prior to your test. Do not take any antihistamines 12 hours prior to your test.  On the Day of the Test: Drink plenty of water until 1 hour prior to the test. Do not eat any food 1 hour prior to test. You may take your regular medications prior to the test.  Take metoprolol (Lopressor) two hours prior to test. FEMALES- please wear underwire-free bra if available, avoid dresses & tight clothing      After the Test: Drink plenty of water. After receiving IV contrast, you may experience a mild flushed feeling. This is normal. On occasion, you may experience a mild rash up to 24 hours after the test. This is not dangerous. If this occurs, you can take Benadryl 25 mg and increase your fluid intake. If you experience trouble breathing, this can be serious. If it is severe call 911 IMMEDIATELY. If it is mild, please call our office. If you take any of these medications: Glipizide/Metformin, Avandament, Glucavance, please do not take 48 hours after completing test  unless otherwise instructed.  We will call to schedule your test 2-4 weeks out understanding that some insurance companies will need an authorization prior to the service being performed.   For more information and frequently asked questions, please visit our website : [email or web address]  For non-scheduling related questions, please contact the cardiac imaging nurse navigator should you have any questions/concerns: Cardiac  Imaging Nurse Navigators Direct Office Dial: [phone]   For scheduling needs, including cancellations and rescheduling, please call [place], [phone].

## 2023-12-07 ENCOUNTER — Ambulatory Visit (HOSPITAL_COMMUNITY)

## 2024-01-09 ENCOUNTER — Other Ambulatory Visit (HOSPITAL_COMMUNITY)

## 2024-04-18 ENCOUNTER — Ambulatory Visit: Admitting: Family Medicine

## 2024-04-25 ENCOUNTER — Other Ambulatory Visit: Payer: Self-pay

## 2024-04-25 ENCOUNTER — Ambulatory Visit (INDEPENDENT_AMBULATORY_CARE_PROVIDER_SITE_OTHER): Admitting: Family Medicine

## 2024-04-25 ENCOUNTER — Encounter: Payer: Self-pay | Admitting: Family Medicine

## 2024-04-25 VITALS — BP 137/81 | Ht 67.0 in | Wt 250.0 lb

## 2024-04-25 DIAGNOSIS — M7711 Lateral epicondylitis, right elbow: Secondary | ICD-10-CM | POA: Insufficient documentation

## 2024-04-25 DIAGNOSIS — M25521 Pain in right elbow: Secondary | ICD-10-CM

## 2024-04-25 MED ORDER — NITROGLYCERIN 0.2 MG/HR TD PT24: MEDICATED_PATCH | TRANSDERMAL | 1 refills | Status: AC

## 2024-04-25 NOTE — Patient Instructions (Signed)

## 2024-04-25 NOTE — Progress Notes (Signed)
 Established Patient Office Visit  Subjective   Patient ID: Rachel Ford, female    DOB: 07-24-1971  Age: 53 y.o. MRN: [id number]  CC: Rt elbow pain   HPI 53 year old right-hand-dominant female with complaint of right lateral elbow pain Patient has been having lateral elbow pain for about 3 months.   This pain all started after she began doing a lot of backyard work where she built a retaining wall and did a lot of shoveling as well.   - She was regularly carrying heavy pavers weighing 8 to 9 pounds with a single hand Her pain improved a little bit after staying away from yard work for a few days, however returned worse than before after she started doing the yard work again. Notes difficulty lifting items, also sometimes having pain when pulling up cover while in bed Has not really tried any therapies. Has been using counterforce brace which does seem to help some Denies prior issues with the elbow Denies any numbness or tingling    Objective:     BP 137/81   Ht 5' 7 (1.702 m)   Wt 250 lb (113.4 kg)   BMI 39.16 kg/m     Physical Exam General: NAD, well-appearing Skin: No rashes or lesions MSK: Elbow: Right elbow No visual deformities, erythema, swelling No tenderness to palpation at lateral epicondyle, however very tender to palpation along the common extensor tendon  full range of motion with flexion and extension of the elbow.  Full range of motion with pronation, supination limited due to pain (resisted supination not assessed as limited by pain) Pain reproduced at lateral epicondyle with resisted extension of wrist, and resisted third finger extension, no pain with flexion of wrist.   5/5 strength with elbow flexion and extension Left elbow with full range of motion without pain, weakness.  No tenderness over the lateral epicondyle Neuro: Sensation intact to light touch Vascular: Pulses 2+ and symmetric radial artery bilaterally  Imaging: Limited MSK  ultrasound of the right elbow Date: 04/25/2024 Indication: Elbow pain Findings: - Hypoechoic change noted throughout the tendon.  Appears to have a partial tear of the common extensor tendon at origin on the lateral epicondyle, approximately 25% of the tendon well-visualized in long and short axis views.  No increased Doppler flow or hyperemia is noted - Normal-appearing radiocapitellar joint  Impression: - Partial tear of the common extensor tendon of the right elbow Images and interpretation completed by Rainell Cedar, DO     Assessment & Plan:   Problem List Items Addressed This Visit     Lateral epicondylitis of right elbow - Primary   Right lateral elbow pain times several months with MSK ultrasound today showing partial tearing of the common extensor tendon.  Plan: - Ultrasound findings reviewed, diagnosis and treatment discussed - Patient is candidate for topical nitroglycerin  therapy.  She notes remote history of migraine headaches, has not had any issues for several years.  Denies any adhesive allergy.  Has done well with topical nitroglycerin  for Achilles issue in the past without any headaches or other side effects. - Will proceed with home-based physical therapy - HEP given today - Rx topical nitroglycerin  patch 0.2 mg apply quarter patch to the right lateral elbow and change daily.  Explained how to take medication and common side effects. - Will follow back up in 6 weeks and repeat ultrasound to assess for improvement.  Of note, patient will be losing her insurance at the end of September.  We  did discuss briefly other treatment modalities including PRP and shockwave therapy, she does not think that she would be interested in any of these modalities in the future.      Relevant Orders   US  LIMITED JOINT SPACE STRUCTURES UP RIGHT     Rainell JAYSON Cedar, DO

## 2024-04-25 NOTE — Assessment & Plan Note (Addendum)
 Right lateral elbow pain times several months with MSK ultrasound today showing partial tearing of the common extensor tendon.  Plan: - Ultrasound findings reviewed, diagnosis and treatment discussed - Patient is candidate for topical nitroglycerin  therapy.  She notes remote history of migraine headaches, has not had any issues for several years.  Denies any adhesive allergy.  Has done well with topical nitroglycerin  for Achilles issue in the past without any headaches or other side effects. - Will proceed with home-based physical therapy - HEP given today - Rx topical nitroglycerin  patch 0.2 mg apply quarter patch to the right lateral elbow and change daily.  Explained how to take medication and common side effects. - Will follow back up in 6 weeks and repeat ultrasound to assess for improvement.  Of note, patient will be losing her insurance at the end of September.  We did discuss briefly other treatment modalities including PRP and shockwave therapy, she does not think that she would be interested in any of these modalities in the future.

## 2024-05-23 ENCOUNTER — Encounter: Payer: Self-pay | Admitting: Family Medicine

## 2024-05-23 ENCOUNTER — Ambulatory Visit (INDEPENDENT_AMBULATORY_CARE_PROVIDER_SITE_OTHER): Admitting: Family Medicine

## 2024-05-23 VITALS — BP 124/80 | Ht 67.0 in | Wt 250.0 lb

## 2024-05-23 DIAGNOSIS — M7711 Lateral epicondylitis, right elbow: Secondary | ICD-10-CM | POA: Diagnosis not present

## 2024-05-23 NOTE — Progress Notes (Unsigned)
 DATE OF VISIT: 05/23/2024        Rachel Ford DOB: [birth year] MRN: [id number]  CC:  f/u rt elbow  History of present Illness: Rachel Ford is a 53 y.o. female who presents for a follow-up visit for right lateral epicondylitis Last seen by me 04/25/24 - was started on topical NTG patches and given HEP  Since last visit she reports ongoing pain Has been using topical nitroglycerin  patches, did have some headache which she tried using a half patch, remove the patch and headache resolved Has been able to return to using corder patch without any issues Has been doing home exercises 3 to 4 days a week, notes some discomfort with grip strength Is noting some pain along the media elbow l, as well as along the posterior aspect of the elbow Denies any numbness or tingling Is using counterforce brace over the lateral epicondyle, but also above the elbow which she does find to be comfortable  Medications:  Outpatient Encounter Medications as of 05/23/2024  Medication Sig   amLODipine (NORVASC) 5 MG tablet Take 5 mg by mouth daily.   ketorolac  (TORADOL ) 10 MG tablet Take 1 tablet by mouth every 8 (eight) hours as needed.   metoprolol  tartrate (LOPRESSOR ) 25 MG tablet Take 1 tablet (25 mg total) by mouth once for 1 dose. Take 90-120 minutes prior to scan. Hold for systolic blood pressure (top number) less than 100 and/ or heart rate less than 55. STOP taking once your scan is complete.   nitroGLYCERIN  (NITRODUR - DOSED IN MG/24 HR) 0.2 mg/hr patch Use 1/4 patch daily to the affected area.   tamsulosin  (FLOMAX ) 0.4 MG CAPS capsule Take 0.4 mg by mouth daily as needed.   No facility-administered encounter medications on file as of 05/23/2024.    Allergies: is allergic to codeine.  Physical Examination: Vitals: BP 124/80   Ht 5' 7 (1.702 m)   Wt 250 lb (113.4 kg)   BMI 39.16 kg/m  GENERAL:  Rachel Ford is a 53 y.o. female appearing their stated age, alert and  oriented x 3, in no apparent distress.  SKIN: no rashes or lesions, skin clean, dry, intact MSK: Elbow: Right elbow without any gross deformity.  Full range of motion with some pain at terminal flexion and extension.  Tender to palpation over the lateral epicondyle and over the extensor bundle.  Normal tenderness along the medial epicondyle and along the distal tricep.  No tenderness over the olecranon.  Increased pain with resisted third finger extension, increased pain with resisted wrist extension, no pain with resisted wrist flexion.  Slight decreased grip strength due to pain. Left elbow with full range of motion without pain or weakness Neurovascular intact distally  Assessment & Plan Lateral epicondylitis of right elbow Right lateral epicondylitis with partial tear of the common extensor tendon as seen on MSK ultrasound at previous visit 04/25/2024.  Minimal improvement over the last several weeks, has only been doing the home exercises intermittently.  Has been compliant with topical nitroglycerin  therapy  Plan: - Brief MSK ultrasound completed today.  Images were not saved.  No significant change in tear of the common extensor tendon.  Small bone spur noted along the olecranon, but normal-appearing triceps tendon.  Normal-appearing medial elbow and flexor mass. - Should continue with topical nitroglycerin  therapy as she is doing.  Did indicate typical treatment duration is 12 weeks, she has only completed approximately 4 weeks at this time.  Not having any side effects.  Did review proper placement of the patch. - Has only been doing home exercises intermittently.  Recommended that she do exercises at least daily, try to do twice a day if she can.  This should help with the healing process - Is okay to continue using counterforce braces as she is doing - Did discuss trial of elbow sleeve as needed.  Recommend she purchase a neoprene sleeve if she wants to try this - She states will be losing  insurance at the end of the month, will be switching to it sounds to be a higher deductible type plan. - Follow-up 6 weeks for reevaluation, sooner as needed.  Could consider possible cortisone injection, shockwave therapy, PRP if symptoms are not improving.   Patient expressed understanding & agreement with above.  Encounter Diagnosis  Name Primary?   Lateral epicondylitis of right elbow Yes    No orders of the defined types were placed in this encounter.

## 2024-05-24 NOTE — Assessment & Plan Note (Signed)
 Right lateral epicondylitis with partial tear of the common extensor tendon as seen on MSK ultrasound at previous visit 04/25/2024.  Minimal improvement over the last several weeks, has only been doing the home exercises intermittently.  Has been compliant with topical nitroglycerin  therapy  Plan: - Brief MSK ultrasound completed today.  Images were not saved.  No significant change in tear of the common extensor tendon.  Small bone spur noted along the olecranon, but normal-appearing triceps tendon.  Normal-appearing medial elbow and flexor mass. - Should continue with topical nitroglycerin  therapy as she is doing.  Did indicate typical treatment duration is 12 weeks, she has only completed approximately 4 weeks at this time.  Not having any side effects.  Did review proper placement of the patch. - Has only been doing home exercises intermittently.  Recommended that she do exercises at least daily, try to do twice a day if she can.  This should help with the healing process - Is okay to continue using counterforce braces as she is doing - Did discuss trial of elbow sleeve as needed.  Recommend she purchase a neoprene sleeve if she wants to try this - She states will be losing insurance at the end of the month, will be switching to it sounds to be a higher deductible type plan. - Follow-up 6 weeks for reevaluation, sooner as needed.  Could consider possible cortisone injection, shockwave therapy, PRP if symptoms are not improving.

## 2024-07-04 ENCOUNTER — Ambulatory Visit: Admitting: Family Medicine

## 2024-07-08 ENCOUNTER — Other Ambulatory Visit: Payer: Self-pay | Admitting: Obstetrics and Gynecology

## 2024-07-08 DIAGNOSIS — Z1231 Encounter for screening mammogram for malignant neoplasm of breast: Secondary | ICD-10-CM

## 2024-07-22 ENCOUNTER — Ambulatory Visit: Admitting: Podiatry

## 2024-07-22 ENCOUNTER — Ambulatory Visit (INDEPENDENT_AMBULATORY_CARE_PROVIDER_SITE_OTHER)

## 2024-07-22 DIAGNOSIS — M2041 Other hammer toe(s) (acquired), right foot: Secondary | ICD-10-CM

## 2024-07-22 DIAGNOSIS — M19071 Primary osteoarthritis, right ankle and foot: Secondary | ICD-10-CM | POA: Diagnosis not present

## 2024-07-22 DIAGNOSIS — M205X1 Other deformities of toe(s) (acquired), right foot: Secondary | ICD-10-CM

## 2024-07-22 MED ORDER — METHYLPREDNISOLONE 4 MG PO TBPK: ORAL_TABLET | ORAL | 0 refills | Status: AC

## 2024-07-22 MED ORDER — DEXAMETHASONE SODIUM PHOSPHATE 120 MG/30ML IJ SOLN
2.0000 mg | Freq: Once | INTRAMUSCULAR | Status: AC
  Administered 2024-07-22: 2 mg via INTRA_ARTICULAR

## 2024-07-22 NOTE — Progress Notes (Signed)
 Subjective:  Patient ID: Rachel Ford, female    DOB: [birth year],  MRN: [id number] HPI Chief Complaint  Patient presents with   Toe Pain    Right foot toe pain -she has had the bunion for a while - pain in the toe is fairly new    53 y.o. female presents with the above complaint.   ROS: Denies fever chills nausea mobic muscle aches pains calf pain back pain chest pain shortness of breath.  Past Medical History:  Diagnosis Date   Depression    post partum   Factor 5 Leiden mutation, heterozygous 09/20/2012   Hyperlipidemia    Lumbar herniated disc    Menorrhagia    OCD (obsessive compulsive disorder)    PCOS (polycystic ovarian syndrome)    Past Surgical History:  Procedure Laterality Date   CESAREAN SECTION     CHOLECYSTECTOMY     herniated disc surgery     WISDOM TOOTH EXTRACTION      Current Outpatient Medications:    methylPREDNISolone (MEDROL DOSEPAK) 4 MG TBPK tablet, 6 day dose pack - take as directed, Disp: 21 tablet, Rfl: 0   amLODipine (NORVASC) 5 MG tablet, Take 5 mg by mouth daily., Disp: , Rfl:    ketorolac  (TORADOL ) 10 MG tablet, Take 1 tablet by mouth every 8 (eight) hours as needed., Disp: , Rfl:    metoprolol  tartrate (LOPRESSOR ) 25 MG tablet, Take 1 tablet (25 mg total) by mouth once for 1 dose. Take 90-120 minutes prior to scan. Hold for systolic blood pressure (top number) less than 100 and/ or heart rate less than 55. STOP taking once your scan is complete., Disp: 14 tablet, Rfl: 0   nitroGLYCERIN  (NITRODUR - DOSED IN MG/24 HR) 0.2 mg/hr patch, Use 1/4 patch daily to the affected area., Disp: 30 patch, Rfl: 1   tamsulosin  (FLOMAX ) 0.4 MG CAPS capsule, Take 0.4 mg by mouth daily as needed., Disp: , Rfl:   Allergies  Allergen Reactions   Codeine Diarrhea and Nausea And Vomiting   Review of Systems Objective:  There were no vitals filed for this visit.  General: Well developed, nourished, in no acute distress, alert and oriented x3    Dermatological: Skin is warm, dry and supple bilateral. Nails x 10 are well maintained; remaining integument appears unremarkable at this time. There are no open sores, no preulcerative lesions, no rash or signs of infection present.  Vascular: Dorsalis Pedis artery and Posterior Tibial artery pedal pulses are 2/4 bilateral with immedate capillary fill time. Pedal hair growth present. No varicosities and no lower extremity edema present bilateral.   Neruologic: Grossly intact via light touch bilateral. Vibratory intact via tuning fork bilateral. Protective threshold with Semmes Wienstein monofilament intact to all pedal sites bilateral. Patellar and Achilles deep tendon reflexes 2+ bilateral. No Babinski or clonus noted bilateral.   Musculoskeletal: No gross boney pedal deformities bilateral. No pain, crepitus, or limitation noted with foot and ankle range of motion bilateral. Muscular strength 5/5 in all groups tested bilateral.  She has pain on palpation first metatarsal phalangeal joint of the right foot.  Distraction of the toe is painful as well as pain on palpation of the dorsal medial aspect of the first metatarsal phalangeal joint.  Gait: Unassisted, Nonantalgic.    Radiographs:  Radiographs taken today demonstrate a slight bunion deformity with some early joint space narrowing.  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  She will follow-up with ibuprofen that she has at home.  I injected 2 mg of dexamethasone after sterile alcohol skin prep.  She tolerated procedure well will follow-up with her on an as-needed basis did discuss appropriate shoe gear.     Rachel Ford, [place]

## 2024-07-31 ENCOUNTER — Inpatient Hospital Stay: Admission: RE | Admit: 2024-07-31 | Discharge: 2024-07-31

## 2024-07-31 DIAGNOSIS — Z1231 Encounter for screening mammogram for malignant neoplasm of breast: Secondary | ICD-10-CM

## 2024-08-01 ENCOUNTER — Ambulatory Visit: Admitting: Family Medicine
